# Patient Record
Sex: Female | Born: 1995 | Race: Black or African American | Hispanic: No | Marital: Single
Health system: Midwestern US, Community
[De-identification: ages and names within clinical notes are randomized; demographics above are authoritative.]

## PROBLEM LIST (undated history)

## (undated) ENCOUNTER — Inpatient Hospital Stay (HOSPITAL_COMMUNITY): Payer: Self-pay

## (undated) DIAGNOSIS — L508 Other urticaria: Secondary | ICD-10-CM

## (undated) DIAGNOSIS — D682 Hereditary deficiency of other clotting factors: Secondary | ICD-10-CM

## (undated) DIAGNOSIS — J45909 Unspecified asthma, uncomplicated: Secondary | ICD-10-CM

## (undated) DIAGNOSIS — D649 Anemia, unspecified: Secondary | ICD-10-CM

## (undated) HISTORY — PX: ABCESS DRAINAGE: SHX399

## (undated) HISTORY — PX: HERNIA REPAIR: SHX51

## (undated) HISTORY — PX: TONSILLECTOMY: SUR1361

## (undated) HISTORY — PX: TYMPANOSTOMY TUBE PLACEMENT: SHX32

## (undated) HISTORY — PX: TONSILLECTOMY AND ADENOIDECTOMY: SUR1326

## (undated) HISTORY — PX: TUMOR REMOVAL: SHX12

---
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
## 2015-05-23 ENCOUNTER — Inpatient Hospital Stay
Admit: 2015-05-23 | Discharge: 2015-05-23 | Disposition: A | Discharge status: Home / Self Care | Payer: Self-pay | Attending: Family Medicine

## 2015-05-23 DIAGNOSIS — R399 Unspecified symptoms and signs involving the genitourinary system: Secondary | ICD-10-CM

## 2015-05-23 LAB — POC URINE MACROSCOPIC
Bilirubin (POC): NEGATIVE
Glucose, urine (POC): NEGATIVE mg/dL
Ketones (POC): 15 mg/dL — AB
Nitrite (POC): NEGATIVE
Protein (POC): NEGATIVE mg/dL
Spec. gravity (POC): 1.03 (ref 1.003–1.030)
Urobilinogen (POC): 0.2 EU/dL (ref 0.2–1.0)
pH, urine  (POC): 6 (ref 5.0–8.0)

## 2015-05-23 LAB — HCG URINE, QL. - POC: Pregnancy test,urine (POC): NEGATIVE

## 2015-05-23 MED ORDER — FLUCONAZOLE 150 MG TAB
150 mg | ORAL_TABLET | ORAL | Status: AC
Start: 2015-05-23 — End: 2015-05-23

## 2015-05-23 MED ORDER — TRIMETHOPRIM-SULFAMETHOXAZOLE 160 MG-800 MG TAB
160-800 mg | ORAL_TABLET | Freq: Two times a day (BID) | ORAL | Status: AC
Start: 2015-05-23 — End: 2015-05-30

## 2015-05-23 NOTE — Other (Addendum)
Patient is a 19 y.o. female presenting with urinary tract infection. The history is provided by the patient.   Bladder Infection   This is a new problem. The current episode started 2 days ago. The problem occurs every urination. The problem has not changed since onset.The quality of the pain is described as burning. The pain is at a severity of 6/10. The pain is moderate. There has been no fever. She is sexually active. There is a history of pyelonephritis. Pertinent negatives include no chills, no sweats, no nausea, no vomiting, no discharge, no frequency, no hematuria, no hesitancy, no possible pregnancy, no urgency and no flank pain. Treatments tried: patient reports hx of same symptoms 1 month ago and treated for kidney infection with ciprofloxacin. Her past medical history does not include kidney stones. Past medical history comments: UTI.        Past Medical History   Diagnosis Date   ??? Factor VII deficiency (HCC)    ??? Anemia         History reviewed. No pertinent past surgical history.      History reviewed. No pertinent family history.     History     Social History   ??? Marital Status: SINGLE     Spouse Name: N/A   ??? Number of Children: N/A   ??? Years of Education: N/A     Occupational History   ??? Not on file.     Social History Main Topics   ??? Smoking status: Never Smoker    ??? Smokeless tobacco: Not on file   ??? Alcohol Use: Not on file   ??? Drug Use: Not on file   ??? Sexual Activity: Not on file     Other Topics Concern   ??? Not on file     Social History Narrative   ??? No narrative on file                ALLERGIES: Motrin    Review of Systems   Constitutional: Negative for fever, chills, diaphoresis and fatigue.   Gastrointestinal: Negative for nausea, vomiting, abdominal pain and diarrhea.   Genitourinary: Positive for dysuria and vaginal discharge (white discharge and vaginal itching.). Negative for hesitancy, urgency, frequency, hematuria, flank pain, decreased urine volume, vaginal bleeding, enuresis,  difficulty urinating, genital sores, vaginal pain, pelvic pain and dyspareunia.        Patient concerned she has an external vaginal cut and would like an external vaginal exam.    Musculoskeletal: Negative for myalgias.   Skin: Negative for color change, pallor and rash.       Filed Vitals:    05/23/15 1728   BP: 133/74   Pulse: 85   Temp: 98.1 ??F (36.7 ??C)   Resp: 18   Height:  (1.702 m)   Weight: 102.513 kg (226 lb)   SpO2: 96%       Physical Exam   Constitutional: She appears well-developed and well-nourished. She is active.  Non-toxic appearance. She does not appear ill. No distress.   Cardiovascular: Normal rate.    Pulmonary/Chest: Effort normal. No respiratory distress.   Abdominal: There is no CVA tenderness.   Genitourinary:       Pelvic exam was performed with patient supine. No labial fusion. There is no rash, tenderness, lesion or injury on the right labia. There is lesion (linear lesion (cut/tear) at fold of labia major and labia minor.) on the left labia. There is no rash, tenderness or injury on  the left labia. No erythema, tenderness or bleeding in the vagina. No foreign body around the vagina. No signs of injury around the vagina. Vaginal discharge (white (scant)) found.   Patient declines internal genital exam.    Neurological: She is alert. She is not disoriented. Gait normal.   Skin: Skin is warm and dry. She is not diaphoretic.   Psychiatric: She has a normal mood and affect. Her behavior is normal. Judgment and thought content normal.   Nursing note and vitals reviewed.      MDM     Differential Diagnosis; Clinical Impression; Plan:     CLINICAL IMPRESSION:  Symptoms of urinary tract infection  (primary encounter diagnosis)  Itching in the vaginal area (suspect yeast infection)    Patient declines internal vaginal exam. Discussed with patient STD and other vaginal tests cannot be obtained without internal pelvic/vaginal exam. Patient aware and continues to decline internal vaginal exam.    UPT negative  UA dip - see result   Urine culture     Plan:  Bactrim DS   Diflucan   Primary Care Provider for follow up as needed.   Emergency Department for worsening symptoms.   Plan discussed and patient is in agreement.         Amount and/or Complexity of Data Reviewed:   Clinical lab tests:  Ordered and reviewed   Review and summarize past medical records:  Yes   Discuss the patient with another provider:  Yes  Risk of Significant Complications, Morbidity, and/or Mortality:   Presenting problems:  Low  Diagnostic procedures:  Low  Management options:  Low  Progress:   Patient progress:  Stable      Procedures

## 2015-05-24 ENCOUNTER — Inpatient Hospital Stay: Admit: 2015-05-24 | Payer: Self-pay

## 2015-05-25 LAB — CULTURE, URINE
Colonies Counted: >100000
Colony Count: >100000

## 2015-05-26 NOTE — Other (Signed)
Left message with pt to check and see how she was doing.

## 2015-06-22 ENCOUNTER — Inpatient Hospital Stay
Admit: 2015-06-22 | Discharge: 2015-06-22 | Disposition: A | Discharge status: Home / Self Care | Payer: Self-pay | Attending: Family Medicine

## 2015-06-22 DIAGNOSIS — N390 Urinary tract infection, site not specified: Secondary | ICD-10-CM

## 2015-06-22 LAB — POC CHEM8
Anion gap (POC): 22 mmol/L — ABNORMAL HIGH (ref 5–15)
BUN (POC): 9 MG/DL (ref 9–20)
CO2 (POC): 22 MMOL/L (ref 21–32)
Calcium, ionized (POC): 1.25 MMOL/L (ref 1.12–1.32)
Chloride (POC): 102 MMOL/L (ref 98–107)
Creatinine (POC): 0.7 MG/DL (ref 0.6–1.3)
GFRAA, POC: >60 mL/min/{1.73_m2} (ref >60)
GFRNA, POC: >60 mL/min/{1.73_m2} (ref >60)
Glucose (POC): 119 MG/DL — ABNORMAL HIGH (ref 65–105)
Hematocrit (POC): 36 % (ref 35.0–47.0)
Hemoglobin (POC): 12.2 GM/DL (ref 11.5–16.0)
Potassium (POC): 3.9 MMOL/L (ref 3.5–5.1)
Sodium (POC): 141 MMOL/L (ref 136–145)

## 2015-06-22 LAB — POC URINE MACROSCOPIC
Bilirubin (POC): NEGATIVE
Glucose, urine (POC): NEGATIVE mg/dL
Ketones (POC): NEGATIVE mg/dL
Nitrite (POC): NEGATIVE
Protein (POC): NEGATIVE mg/dL
Spec. gravity (POC): 1.03 (ref 1.003–1.030)
Urobilinogen (POC): 0.2 EU/dL (ref 0.2–1.0)
pH, urine  (POC): 6 (ref 5.0–8.0)

## 2015-06-22 LAB — HCG URINE, QL. - POC: Pregnancy test,urine (POC): NEGATIVE

## 2015-06-22 MED ORDER — FLUCONAZOLE 150 MG TAB
150 mg | ORAL_TABLET | ORAL | Status: AC
Start: 2015-06-22 — End: 2015-06-22

## 2015-06-22 MED ORDER — CIPROFLOXACIN 500 MG TAB
500 mg | ORAL_TABLET | Freq: Two times a day (BID) | ORAL | Status: AC
Start: 2015-06-22 — End: 2015-06-25

## 2015-06-22 NOTE — Other (Addendum)
Patient is a 19 y.o. female presenting with vaginal discharge and chest pain. The history is provided by the patient.   Vaginal Discharge   This is a new problem. Episode onset: with itching. The problem occurs constantly. The problem has not changed since onset.The discharge occurs after urination. The discharge was white, cottage cheese like and thick. She is not pregnant. She has not missed her period. Associated symptoms include genital itching. Pertinent negatives include no anorexia, no diaphoresis, no fever, no abdominal swelling, no abdominal pain, no constipation, no diarrhea, no nausea, no vomiting, no dysuria, no frequency, no genital burning, no genital lesions, no perineal pain, no perineal odor and no painful intercourse. She has tried nothing for the symptoms.   Chest Pain (Angina)   This is a new problem. Episode onset: pain under left breast , does not have pain now. The problem has not changed since onset.Episode frequency: intermittent. The pain is associated with normal activity. The pain is present in the left side. The pain is at a severity of 6/10. The quality of the pain is described as sharp. The pain does not radiate. The symptoms are aggravated by movement. Pertinent negatives include no abdominal pain, no back pain, no claudication, no cough, no diaphoresis, no dizziness, no exertional chest pressure, no fever, no headaches, no hemoptysis, no irregular heartbeat, no leg pain, no lower extremity edema, no malaise/fatigue, no nausea, no near-syncope, no numbness, no orthopnea, no palpitations, no PND, no shortness of breath, no sputum production, no vomiting and no weakness. She has tried nothing for the symptoms.        Past Medical History   Diagnosis Date   ??? Factor VII deficiency (HCC)    ??? Anemia         History reviewed. No pertinent past surgical history.      History reviewed. No pertinent family history.     History     Social History   ??? Marital Status: SINGLE      Spouse Name: N/A   ??? Number of Children: N/A   ??? Years of Education: N/A     Occupational History   ??? Not on file.     Social History Main Topics   ??? Smoking status: Never Smoker    ??? Smokeless tobacco: Not on file   ??? Alcohol Use: Not on file   ??? Drug Use: Not on file   ??? Sexual Activity: Not on file     Other Topics Concern   ??? Not on file     Social History Narrative                ALLERGIES: Motrin    Review of Systems   Constitutional: Negative for fever, malaise/fatigue and diaphoresis.   Respiratory: Negative for cough, hemoptysis, sputum production and shortness of breath.    Cardiovascular: Positive for chest pain. Negative for palpitations, orthopnea, claudication, PND and near-syncope.   Gastrointestinal: Negative for nausea, vomiting, abdominal pain, diarrhea, constipation and anorexia.   Genitourinary: Positive for vaginal discharge. Negative for dysuria and frequency.   Musculoskeletal: Negative for back pain.   Neurological: Negative for dizziness, weakness, numbness and headaches.   All other systems reviewed and are negative.      Filed Vitals:    06/22/15 1835   BP: 119/81   Pulse: 98   Temp: 98.2 ??F (36.8 ??C)   Resp: 18   Height: 5\' 7"  (1.702 m)   Weight: 102.967 kg (227 lb)  SpO2: 98%       Physical Exam   Constitutional: She is oriented to person, place, and time. She appears well-developed and well-nourished.   HENT:   Head: Normocephalic.   Eyes: Conjunctivae are normal.   Neck: Normal range of motion. Neck supple.   Cardiovascular: Normal rate and regular rhythm.    No chest pain at this time  No shortness of breath   Pulmonary/Chest: Effort normal and breath sounds normal. No respiratory distress. She has no wheezes. She has no rales. She exhibits no tenderness.   Abdominal: Soft. Bowel sounds are normal. She exhibits no distension.   No suprapubic tenderness   Genitourinary: Vaginal discharge found.   White clumpy discharge noted on inspection  Denied vaginal exam    Musculoskeletal: Normal range of motion. She exhibits tenderness.   Tenderness to left side lateral breast,rib area pain is reproducible  Injury to ribs previous hx had xrays    No deformity   Neurological: She is alert and oriented to person, place, and time.   Skin: Skin is warm and dry.   Inner right thigh with non tender sebaceous cyst   Psychiatric: She has a normal mood and affect. Her behavior is normal.   Vitals reviewed.      MDM     Differential Diagnosis; Clinical Impression; Plan:     CLINICAL IMPRESSION:  Urinary tract infection with hematuria, site unspecified  (primary encounter diagnosis)    Plan:  1. No chest pain   2. Ed for worseningsymptoms  3. Chem 8-blood disorder recent heavy menstruation  Urine culture/hcg  cipro   Call for results of urine  2 days  GYN follow up  General surgeon for sebaceous cyst to inner right thigh, info gven  Amount and/or Complexity of Data Reviewed:   Clinical lab tests:  Ordered   Review and summarize past medical records:  Yes      Procedures

## 2015-06-23 ENCOUNTER — Inpatient Hospital Stay: Admit: 2015-06-23 | Payer: Self-pay

## 2015-06-23 LAB — CHLAMYDIA/GC PCR
Chlamydia amplified: NEGATIVE
N. gonorrhea, amplified: NEGATIVE

## 2015-06-24 LAB — CULTURE, URINE
Colonies Counted: >100000
Colony Count: >100000

## 2018-08-25 ENCOUNTER — Other Ambulatory Visit: Payer: Self-pay

## 2018-08-25 ENCOUNTER — Inpatient Hospital Stay (HOSPITAL_COMMUNITY): Payer: Medicaid Other

## 2018-08-25 ENCOUNTER — Encounter (HOSPITAL_COMMUNITY): Payer: Self-pay | Admitting: *Deleted

## 2018-08-25 ENCOUNTER — Inpatient Hospital Stay (HOSPITAL_COMMUNITY)
Admission: AD | Admit: 2018-08-25 | Discharge: 2018-08-25 | Disposition: A | Discharge status: Home / Self Care | Payer: Medicaid Other | Source: Ambulatory Visit | Attending: Obstetrics and Gynecology | Admitting: Obstetrics and Gynecology

## 2018-08-25 DIAGNOSIS — O208 Other hemorrhage in early pregnancy: Secondary | ICD-10-CM | POA: Insufficient documentation

## 2018-08-25 DIAGNOSIS — O26899 Other specified pregnancy related conditions, unspecified trimester: Secondary | ICD-10-CM

## 2018-08-25 DIAGNOSIS — J45909 Unspecified asthma, uncomplicated: Secondary | ICD-10-CM | POA: Insufficient documentation

## 2018-08-25 DIAGNOSIS — O26891 Other specified pregnancy related conditions, first trimester: Secondary | ICD-10-CM | POA: Diagnosis not present

## 2018-08-25 DIAGNOSIS — D682 Hereditary deficiency of other clotting factors: Secondary | ICD-10-CM | POA: Insufficient documentation

## 2018-08-25 DIAGNOSIS — Z886 Allergy status to analgesic agent status: Secondary | ICD-10-CM | POA: Insufficient documentation

## 2018-08-25 DIAGNOSIS — O99511 Diseases of the respiratory system complicating pregnancy, first trimester: Secondary | ICD-10-CM | POA: Insufficient documentation

## 2018-08-25 DIAGNOSIS — Z3A01 Less than 8 weeks gestation of pregnancy: Secondary | ICD-10-CM

## 2018-08-25 DIAGNOSIS — R109 Unspecified abdominal pain: Secondary | ICD-10-CM | POA: Diagnosis present

## 2018-08-25 DIAGNOSIS — O99111 Other diseases of the blood and blood-forming organs and certain disorders involving the immune mechanism complicating pregnancy, first trimester: Secondary | ICD-10-CM | POA: Diagnosis not present

## 2018-08-25 DIAGNOSIS — Z880 Allergy status to penicillin: Secondary | ICD-10-CM | POA: Diagnosis not present

## 2018-08-25 DIAGNOSIS — Z3491 Encounter for supervision of normal pregnancy, unspecified, first trimester: Secondary | ICD-10-CM

## 2018-08-25 DIAGNOSIS — O418X10 Other specified disorders of amniotic fluid and membranes, first trimester, not applicable or unspecified: Secondary | ICD-10-CM | POA: Diagnosis not present

## 2018-08-25 DIAGNOSIS — O468X1 Other antepartum hemorrhage, first trimester: Secondary | ICD-10-CM

## 2018-08-25 HISTORY — DX: Unspecified asthma, uncomplicated: J45.909

## 2018-08-25 HISTORY — DX: Anemia, unspecified: D64.9

## 2018-08-25 HISTORY — DX: Hereditary deficiency of other clotting factors: D68.2

## 2018-08-25 LAB — COMPREHENSIVE METABOLIC PANEL
ALT: 15 U/L (ref 0–44)
ANION GAP: 8 (ref 5–15)
AST: 20 U/L (ref 15–41)
Albumin: 3.8 g/dL (ref 3.5–5.0)
Alkaline Phosphatase: 85 U/L (ref 38–126)
BUN: 10 mg/dL (ref 6–20)
CO2: 23 mmol/L (ref 22–32)
Calcium: 9.5 mg/dL (ref 8.9–10.3)
Chloride: 104 mmol/L (ref 98–111)
Creatinine, Ser: 0.66 mg/dL (ref 0.44–1.00)
GFR calc Af Amer: >60 mL/min (ref >60)
Glucose, Bld: 90 mg/dL (ref 70–99)
POTASSIUM: 4 mmol/L (ref 3.5–5.1)
Sodium: 135 mmol/L (ref 135–145)
Total Bilirubin: 0.4 mg/dL (ref 0.3–1.2)
Total Protein: 7.3 g/dL (ref 6.5–8.1)

## 2018-08-25 LAB — CBC WITH DIFFERENTIAL/PLATELET
BASOS PCT: 0 %
Basophils Absolute: 0 10*3/uL (ref 0.0–0.1)
Eosinophils Absolute: 0.1 10*3/uL (ref 0.0–0.7)
Eosinophils Relative: 1 %
HCT: 32.7 % — ABNORMAL LOW (ref 36.0–46.0)
Hemoglobin: 9.9 g/dL — ABNORMAL LOW (ref 12.0–15.0)
Lymphocytes Relative: 41 %
Lymphs Abs: 3.7 10*3/uL (ref 0.7–4.0)
MCH: 22.9 pg — AB (ref 26.0–34.0)
MCHC: 30.3 g/dL (ref 30.0–36.0)
MCV: 75.7 fL — AB (ref 78.0–100.0)
MONO ABS: 0.5 10*3/uL (ref 0.1–1.0)
MONOS PCT: 5 %
NEUTROS PCT: 53 %
Neutro Abs: 4.7 10*3/uL (ref 1.7–7.7)
Platelets: 303 10*3/uL (ref 150–400)
RBC: 4.32 MIL/uL (ref 3.87–5.11)
RDW: 17.7 % — AB (ref 11.5–15.5)
WBC: 9 10*3/uL (ref 4.0–10.5)

## 2018-08-25 LAB — URINALYSIS, ROUTINE W REFLEX MICROSCOPIC
BILIRUBIN URINE: NEGATIVE
Glucose, UA: NEGATIVE mg/dL
Hgb urine dipstick: NEGATIVE
KETONES UR: NEGATIVE mg/dL
Leukocytes, UA: NEGATIVE
NITRITE: NEGATIVE
PH: 6 (ref 5.0–8.0)
PROTEIN: NEGATIVE mg/dL
Specific Gravity, Urine: 1.016 (ref 1.005–1.030)

## 2018-08-25 LAB — HCG, QUANTITATIVE, PREGNANCY: hCG, Beta Chain, Quant, S: 25205 m[IU]/mL — ABNORMAL HIGH (ref <5)

## 2018-08-25 LAB — ABO/RH: ABO/RH(D): B POS

## 2018-08-25 NOTE — MAU Note (Signed)
Having really bad pains, comes and goes.  Started on Sunday night.  Worse on left side. Hurting when she pees, that started today. No bleeding.  Heavy d/c.preg confirmed in office.

## 2018-08-25 NOTE — MAU Provider Note (Addendum)
History     CSN: [id number]  Arrival date and time: 08/25/18 1807   First Provider Initiated Contact with Patient 08/25/18 1927      Chief Complaint  Patient presents with  . Abdominal Pain  . Vaginal Discharge   HPI   Kathleen [name] is a 22 y.o. female G1P0 @ [redacted]w[redacted]d with a history of Factor VII deficiency here in MAU with abdominal pain. The pain started Sunday night. The pain went away enough to allow her to sleep, and then continued on Monday. The pain is worse on her left side than her right. She has not taken any medications for the pain. She rates her pain 4/10. When the pain comes it is an 8/10. No bleeding. Was seen at [facility] for confirmation only.   OB History    Gravida  1   Para      Term      Preterm      AB      Living        SAB      TAB      Ectopic      Multiple      Live Births              Past Medical History:  Diagnosis Date  . Anemia   . Asthma   . Factor VII deficiency [facility])     Past Surgical History:  Procedure Laterality Date  . ABCESS DRAINAGE    . HERNIA REPAIR    . TONSILLECTOMY    . TONSILLECTOMY AND ADENOIDECTOMY    . TUMOR REMOVAL     non cancerous  . TYMPANOSTOMY TUBE PLACEMENT      No family history on file.  Social History   Tobacco Use  . Smoking status: Never Smoker  . Smokeless tobacco: Never Used  Substance Use Topics  . Alcohol use: Not Currently  . Drug use: Never    Allergies:  Allergies  Allergen Reactions  . Citrus Hives and Itching  . Nsaids Other (See Comments)    Cannot take due to blood disorder  . Penicillins Hives and Swelling    No medications prior to admission.   Results for orders placed or performed during the hospital encounter of 08/25/18 (from the past 48 hour(s))  Urinalysis, Routine w reflex microscopic     Status: None   Collection Time: 08/25/18  6:46 PM  Result Value Ref Range   Color, Urine YELLOW YELLOW   APPearance CLEAR CLEAR   Specific Gravity,  Urine 1.016 1.005 - 1.030   pH 6.0 5.0 - 8.0   Glucose, UA NEGATIVE NEGATIVE mg/dL   Hgb urine dipstick NEGATIVE NEGATIVE   Bilirubin Urine NEGATIVE NEGATIVE   Ketones, ur NEGATIVE NEGATIVE mg/dL   Protein, ur NEGATIVE NEGATIVE mg/dL   Nitrite NEGATIVE NEGATIVE   Leukocytes, UA NEGATIVE NEGATIVE    Comment: Performed at [facility], [address] [place], [place] [address]   Review of Systems  Constitutional: Negative for fever.  Gastrointestinal: Positive for abdominal pain.  Genitourinary: Negative for dysuria and vaginal bleeding.  Musculoskeletal: Negative for back pain.   Physical Exam   Blood pressure 113/70, pulse 86, temperature 99 F (37.2 C), temperature source Oral, resp. rate 18, height 5\' 6"  (1.676 m), weight 107.3 kg, last menstrual period 07/14/2018, SpO2 100 %.  Physical Exam  Constitutional: She is oriented to person, place, and time. She appears well-developed and well-nourished. No distress.  HENT:  Head: Normocephalic.  Eyes: Pupils are equal, round, and reactive to light.  GI: There is tenderness in the periumbilical area, suprapubic area and left lower quadrant. There is no rigidity, no rebound and no guarding.  Musculoskeletal: Normal range of motion.  Neurological: She is alert and oriented to person, place, and time.  Skin: Skin is warm. She is not diaphoretic.  Psychiatric: Her behavior is normal.   MAU Course  Procedures  US Ob Less Than 14 Weeks With Ob Transvaginal  Result Date: 08/25/2018 CLINICAL DATA:  Pregnant patient with abdominal pain. EXAM: OBSTETRIC <14 WK [place] AND TRANSVAGINAL OB US TECHNIQUE: Both transabdominal and transvaginal ultrasound examinations were performed for complete evaluation of the gestation as well as the maternal uterus, adnexal regions, and pelvic cul-de-sac. Transvaginal technique was performed to assess early pregnancy. COMPARISON:  None. FINDINGS: Intrauterine gestational sac: Single Yolk sac:  Visualized. Embryo:   Visualized. Cardiac Activity: Visualized. Heart Rate: 85 bpm MSD:   mm    w     d CRL:  2.3 mm   5 w   5 d                  [place] EDC: April 22, 2019 Subchorionic hemorrhage: A small subchorionic hemorrhage is identified. Maternal uterus/adnexae: Corpus luteum cyst is seen in the right ovary. The left ovary is normal. A small amount of physiologic fluid is seen in the pelvis. IMPRESSION: 1. Single live IUP. 2. Small subchorionic hemorrhage. Electronically Signed   By: Gerome Sam III M.D   On: 08/25/2018 20:55   Results for orders placed or performed during the hospital encounter of 08/25/18 (from the past 24 hour(s))  Urinalysis, Routine w reflex microscopic     Status: None   Collection Time: 08/25/18  6:46 PM  Result Value Ref Range   Color, Urine YELLOW YELLOW   APPearance CLEAR CLEAR   Specific Gravity, Urine 1.016 1.005 - 1.030   pH 6.0 5.0 - 8.0   Glucose, UA NEGATIVE NEGATIVE mg/dL   Hgb urine dipstick NEGATIVE NEGATIVE   Bilirubin Urine NEGATIVE NEGATIVE   Ketones, ur NEGATIVE NEGATIVE mg/dL   Protein, ur NEGATIVE NEGATIVE mg/dL   Nitrite NEGATIVE NEGATIVE   Leukocytes, UA NEGATIVE NEGATIVE  CBC with Differential/Platelet     Status: Abnormal   Collection Time: 08/25/18  7:53 PM  Result Value Ref Range   WBC 9.0 4.0 - 10.5 K/uL   RBC 4.32 3.87 - 5.11 MIL/uL   Hemoglobin 9.9 (L) 12.0 - 15.0 g/dL   HCT 40.9 (L) 81.1 - 91.4 %   MCV 75.7 (L) 78.0 - 100.0 fL   MCH 22.9 (L) 26.0 - 34.0 pg   MCHC 30.3 30.0 - 36.0 g/dL   RDW 78.2 (H) 95.6 - 21.3 %   Platelets 303 150 - 400 K/uL   Neutrophils Relative % 53 %   Neutro Abs 4.7 1.7 - 7.7 K/uL   Lymphocytes Relative 41 %   Lymphs Abs 3.7 0.7 - 4.0 K/uL   Monocytes Relative 5 %   Monocytes Absolute 0.5 0.1 - 1.0 K/uL   Eosinophils Relative 1 %   Eosinophils Absolute 0.1 0.0 - 0.7 K/uL   Basophils Relative 0 %   Basophils Absolute 0.0 0.0 - 0.1 K/uL  Comprehensive metabolic panel     Status: None   Collection Time: 08/25/18  7:53  PM  Result Value Ref Range   Sodium 135 135 - 145 mmol/L   Potassium 4.0 3.5 - 5.1 mmol/L  Chloride 104 98 - 111 mmol/L   CO2 23 22 - 32 mmol/L   Glucose, Bld 90 70 - 99 mg/dL   BUN 10 6 - 20 mg/dL   Creatinine, Ser 8.29 0.44 - 1.00 mg/dL   Calcium 9.5 8.9 - 56.2 mg/dL   Total Protein 7.3 6.5 - 8.1 g/dL   Albumin 3.8 3.5 - 5.0 g/dL   AST 20 15 - 41 U/L   ALT 15 0 - 44 U/L   Alkaline Phosphatase 85 38 - 126 U/L   Total Bilirubin 0.4 0.3 - 1.2 mg/dL   GFR calc non Af Amer >60 >60 mL/min   GFR calc Af Amer >60 >60 mL/min   Anion gap 8 5 - 15  ABO/Rh     Status: None (Preliminary result)   Collection Time: 08/25/18  7:53 PM  Result Value Ref Range   ABO/RH(D)      B POS Performed at [facility], [address], [place], [place] [address]   hCG, quantitative, pregnancy     Status: Abnormal   Collection Time: 08/25/18  7:53 PM  Result Value Ref Range   hCG, Beta Chain, Quant, S 25,205 (H) <5 mIU/mL      MDM  Blood type: HIV, CBC, Hcg, ABO US OB transvaginal  Report given to Judeth Horn NP who resumes care of the patient.   Rasch, Harolyn Rutherford, NP  B positive Ultrasound shows IUP with cardiac activity & small [facility] Discussed results with patient, including low FHR which could be normal this early in the pregnancy. Pt has hemonc appt next week & states her doctor plans on referring her to [facility]. Also recently approved for medicaid so d/t those 2 reasons will not be returning to [facility].   Assessment and Plan  A: 1. Normal IUP (intrauterine pregnancy) on prenatal ultrasound, first trimester   2. Abdominal pain in pregnancy   3. [redacted] weeks gestation of pregnancy   4. Subchorionic hematoma in first trimester, single or unspecified fetus    P: Discharge home Bleeding/SAB precautions Start prenatal care  Judeth Horn, NP

## 2018-08-25 NOTE — Discharge Instructions (Signed)
Subchorionic Hematoma °A subchorionic hematoma is a gathering of blood between the outer wall of the placenta and the inner wall of the womb (uterus). The placenta is the organ that connects the fetus to the wall of the uterus. The placenta performs the feeding, breathing (oxygen to the fetus), and waste removal (excretory work) of the fetus. °Subchorionic hematoma is the most common abnormality found on a result from ultrasonography done during the first trimester or early second trimester of pregnancy. If there has been little or no vaginal bleeding, early small hematomas usually shrink on their own and do not affect your baby or pregnancy. The blood is gradually absorbed over 1-2 weeks. When bleeding starts later in pregnancy or the hematoma is larger or occurs in an older pregnant woman, the outcome may not be as good. Larger hematomas may get bigger, which increases the chances for miscarriage. Subchorionic hematoma also increases the risk of premature detachment of the placenta from the uterus, preterm (premature) labor, and stillbirth. °Follow these instructions at home: °· Stay on bed rest if your health care provider recommends this. Although bed rest will not prevent more bleeding or prevent a miscarriage, your health care provider may recommend bed rest until you are advised otherwise. °· Avoid heavy lifting (more than 10 lb [4.5 kg]), exercise, sexual intercourse, or douching as directed by your health care provider. °· Keep track of the number of pads you use each day and how soaked (saturated) they are. Write down this information. °· Do not use tampons. °· Keep all follow-up appointments as directed by your health care provider. Your health care provider may ask you to have follow-up blood tests or ultrasound tests or both. °Get help right away if: °· You have severe cramps in your stomach, back, abdomen, or pelvis. °· You have a fever. °· You pass large clots or tissue. Save any tissue for your  health care provider to look at. °· Your bleeding increases or you become lightheaded, feel weak, or have fainting episodes. °This information is not intended to replace advice given to you by your health care provider. Make sure you discuss any questions you have with your health care provider. °Document Released: 03/26/2007 Document Revised: 05/16/2016 Document Reviewed: 07/08/2013 °[employer]vier Interactive Patient Education © 2017 [employer] ° °

## 2019-01-22 ENCOUNTER — Encounter (HOSPITAL_COMMUNITY): Payer: Self-pay | Admitting: *Deleted

## 2019-01-22 ENCOUNTER — Inpatient Hospital Stay (HOSPITAL_COMMUNITY)
Admission: AD | Admit: 2019-01-22 | Discharge: 2019-01-22 | Disposition: A | Discharge status: Home / Self Care | Payer: Medicaid Other | Attending: Obstetrics and Gynecology | Admitting: Obstetrics and Gynecology

## 2019-01-22 DIAGNOSIS — R109 Unspecified abdominal pain: Secondary | ICD-10-CM | POA: Diagnosis not present

## 2019-01-22 DIAGNOSIS — O368120 Decreased fetal movements, second trimester, not applicable or unspecified: Secondary | ICD-10-CM

## 2019-01-22 DIAGNOSIS — O99112 Other diseases of the blood and blood-forming organs and certain disorders involving the immune mechanism complicating pregnancy, second trimester: Secondary | ICD-10-CM | POA: Diagnosis not present

## 2019-01-22 DIAGNOSIS — Z3A27 27 weeks gestation of pregnancy: Secondary | ICD-10-CM | POA: Diagnosis not present

## 2019-01-22 DIAGNOSIS — O26891 Other specified pregnancy related conditions, first trimester: Secondary | ICD-10-CM

## 2019-01-22 DIAGNOSIS — D66 Hereditary factor VIII deficiency: Secondary | ICD-10-CM | POA: Insufficient documentation

## 2019-01-22 DIAGNOSIS — O26899 Other specified pregnancy related conditions, unspecified trimester: Secondary | ICD-10-CM

## 2019-01-22 LAB — URINALYSIS, ROUTINE W REFLEX MICROSCOPIC
BILIRUBIN URINE: NEGATIVE
GLUCOSE, UA: NEGATIVE mg/dL
Hgb urine dipstick: NEGATIVE
KETONES UR: NEGATIVE mg/dL
Leukocytes, UA: NEGATIVE
NITRITE: NEGATIVE
PH: 7.5 (ref 5.0–8.0)
Protein, ur: NEGATIVE mg/dL
Specific Gravity, Urine: 1.015 (ref 1.005–1.030)

## 2019-01-22 LAB — WET PREP, GENITAL
CLUE CELLS WET PREP: NONE SEEN
SPERM: NONE SEEN
Trich, Wet Prep: NONE SEEN
YEAST WET PREP: NONE SEEN

## 2019-01-22 NOTE — MAU Provider Note (Signed)
History     CSN: [id number]  Arrival date and time: 01/22/19 0071   First Provider Initiated Contact with Patient 01/22/19 1007      Chief Complaint  Patient presents with  . Decreased Fetal Movement  . Abdominal Pain   Kathleen [name] is a 23 y.o. G1P0 at 108w3d who presents for Decreased Fetal Movement and Abdominal Pain.  Patient states she hasn't felt movement in 2 days with the onset of cramping.  She states the cramping started Tuesday night and has been occurring in "intervals."  She endorses vaginal discharge that is a "cloudy slimy" consistency with a whitish color.  Patient denies sexual activity in the past 72 hours or issues with constipation, diarrhea, or urination.  Patient states she has not taken anything for the pain due to her hemophilia diagnosis.  She states she took a warm shower last night that provided some relief.     OB History    Gravida  1   Para      Term      Preterm      AB      Living        SAB      TAB      Ectopic      Multiple      Live Births              Past Medical History:  Diagnosis Date  . Anemia   . Asthma   . 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 ABCESS DRAINAGE    . HERNIA REPAIR    . TONSILLECTOMY    . TONSILLECTOMY AND ADENOIDECTOMY    . TUMOR REMOVAL     non cancerous  . TYMPANOSTOMY TUBE PLACEMENT      History reviewed. No pertinent family history.  Social History   Tobacco Use  . Smoking status: Never Smoker  . Smokeless tobacco: Never Used  Substance Use Topics  . Alcohol use: Not Currently  . Drug use: Never    Allergies:  Allergies  Allergen Reactions  . Citrus Hives and Itching  . Nsaids Other (See Comments)    Cannot take due to blood disorder  . Penicillins Hives and Swelling    No medications prior to admission.    Review of Systems  Constitutional: Negative for chills and fever.  Gastrointestinal: Positive for abdominal  pain, nausea and vomiting. Negative for constipation and diarrhea.  Genitourinary: Positive for vaginal discharge. Negative for dysuria and vaginal bleeding.  Musculoskeletal: Positive for back pain.   Physical Exam   Temperature 98.4 F (36.9 C), temperature source Oral, resp. rate 20, height 5\' 6"  (1.676 m), weight 107.4 kg, last menstrual period 07/14/2018.  Physical Exam  Constitutional: She is oriented to person, place, and time. She appears well-developed and well-nourished.  HENT:  Head: Normocephalic and atraumatic.  Eyes: Conjunctivae are normal.  Neck: Normal range of motion.  Cardiovascular: Normal rate, regular rhythm and normal heart sounds.  Respiratory: Effort normal and breath sounds normal.  GI: Soft. Bowel sounds are normal.  Genitourinary: Cervix exhibits no motion tenderness and no discharge.    Vaginal discharge present.     No vaginal bleeding.  No bleeding in the vagina.    Genitourinary Comments: Speculum Exam: -Vaginal Vault: Pink mucosa.  Moderate amt thick white discharge -wet prep collected -Cervix:Pink, no lesions, cysts, or polyps.  Appears closed. No active bleeding or discharge from  os-GC/CT collected -Bimanual Exam: Closed/Long/Thick No tenderness in cul de sac   Musculoskeletal: Normal range of motion.  Neurological: She is alert and oriented to person, place, and time.  Skin: Skin is warm and dry.  Psychiatric: She has a normal mood and affect. Her behavior is normal.   135 bpm, Mod Var, -Decels, +Accels Toco: None graphed or palpated  MAU Course  Procedures Results for orders placed or performed during the hospital encounter of 01/22/19 (from the past 24 hour(s))  Wet prep, genital     Status: Abnormal   Collection Time: 01/22/19 10:18 AM  Result Value Ref Range   Yeast Wet Prep HPF POC NONE SEEN NONE SEEN   Trich, Wet Prep NONE SEEN NONE SEEN   Clue Cells Wet Prep HPF POC NONE SEEN NONE SEEN   WBC, Wet Prep HPF POC FEW (A) NONE SEEN    Sperm NONE SEEN   Urinalysis, Routine w reflex microscopic     Status: None   Collection Time: 01/22/19 10:18 AM  Result Value Ref Range   Color, Urine YELLOW YELLOW   APPearance CLEAR CLEAR   Specific Gravity, Urine 1.015 1.005 - 1.030   pH 7.5 5.0 - 8.0   Glucose, UA NEGATIVE NEGATIVE mg/dL   Hgb urine dipstick NEGATIVE NEGATIVE   Bilirubin Urine NEGATIVE NEGATIVE   Ketones, ur NEGATIVE NEGATIVE mg/dL   Protein, ur NEGATIVE NEGATIVE mg/dL   Nitrite NEGATIVE NEGATIVE   Leukocytes, UA NEGATIVE NEGATIVE    MDM Pelvic Exam with cultures Labs: UA, Wet prep, and GC/CT EFM  Assessment and Plan  DFM Abdominal Pain Hemophilia  Cat I FT  -Exam findings discussed -Wet prep Pending -Reactive NST -Offered and declines Tylenol dosing for pain citing hemophilia. Educated that tylenol is not a contraindication to hemophilia. -Will await results  Follow Up (11:00 AM)  -Results return back negative -Patient informed of negative results -No questions or concerns -Encouraged to keep appt as scheduled with primary OB provider -Encouraged to call or return to MAU if symptoms worsen or with the onset of new symptoms. -Discharged to home in stable condition  Cherre Robins MSN, CNM 01/22/2019, 10:07 AM

## 2019-01-22 NOTE — MAU Note (Signed)
C/O intermittent lower abdominal pain and constant back pain for two days.  Also reports not feeling the baby move for past two days.  Denies vaginal bleeding or LOF, but has cloudy discharge. Also c/o a "bump" that appeared 2 days ago under right breast that is sore to the touch.  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

## 2019-01-22 NOTE — Discharge Instructions (Signed)
Abdominal Pain During Pregnancy ° °Abdominal pain is common during pregnancy, and has many possible causes. Some causes are more serious than others, and sometimes the cause is not known. Abdominal pain can be a sign that labor is starting. It can also be caused by normal growth and stretching of muscles and ligaments during pregnancy. Always tell your health care provider if you have any abdominal pain. °Follow these instructions at home: °· Do not have sex or put anything in your vagina until your pain goes away completely. °· Get plenty of rest until your pain improves. °· Drink enough fluid to keep your urine pale yellow. °· Take over-the-counter and prescription medicines only as told by your health care provider. °· Keep all follow-up visits as told by your health care provider. This is important. °Contact a health care provider if: °· Your pain continues or gets worse after resting. °· You have lower abdominal pain that: °? Comes and goes at regular intervals. °? Spreads to your back. °? Is similar to menstrual cramps. °· You have pain or burning when you urinate. °Get help right away if: °· You have a fever or chills. °· You have vaginal bleeding. °· You are leaking fluid from your vagina. °· You are passing tissue from your vagina. °· You have vomiting or diarrhea that lasts for more than 24 hours. °· Your baby is moving less than usual. °· You feel very weak or faint. °· You have shortness of breath. °· You develop severe pain in your upper abdomen. °Summary °· Abdominal pain is common during pregnancy, and has many possible causes. °· If you experience abdominal pain during pregnancy, tell your health care provider right away. °· Follow your health care provider's home care instructions and keep all follow-up visits as directed. °This information is not intended to replace advice given to you by your health care provider. Make sure you discuss any questions you have with your health care  provider. °Document Released: 12/09/2005 Document Revised: 03/13/2017 Document Reviewed: 03/13/2017 °Elsevier Interactive Patient Education © 2019 [employer] ° °

## 2019-01-25 LAB — GC/CHLAMYDIA PROBE AMP (~~LOC~~) NOT AT ARMC
Chlamydia: NEGATIVE
Neisseria Gonorrhea: NEGATIVE

## 2019-07-05 ENCOUNTER — Encounter: Payer: Self-pay | Admitting: Allergy

## 2019-07-05 ENCOUNTER — Other Ambulatory Visit: Payer: Self-pay

## 2019-07-05 ENCOUNTER — Ambulatory Visit (INDEPENDENT_AMBULATORY_CARE_PROVIDER_SITE_OTHER): Payer: Medicaid Other | Admitting: Allergy

## 2019-07-05 VITALS — BP 110/78 | HR 84 | Temp 98.1°F | Resp 16 | Ht 66.0 in | Wt 231.4 lb

## 2019-07-05 DIAGNOSIS — Z880 Allergy status to penicillin: Secondary | ICD-10-CM | POA: Insufficient documentation

## 2019-07-05 DIAGNOSIS — Z91038 Other insect allergy status: Secondary | ICD-10-CM | POA: Insufficient documentation

## 2019-07-05 DIAGNOSIS — T781XXA Other adverse food reactions, not elsewhere classified, initial encounter: Secondary | ICD-10-CM | POA: Insufficient documentation

## 2019-07-05 DIAGNOSIS — L508 Other urticaria: Secondary | ICD-10-CM | POA: Diagnosis not present

## 2019-07-05 DIAGNOSIS — Z91030 Bee allergy status: Secondary | ICD-10-CM

## 2019-07-05 DIAGNOSIS — T781XXD Other adverse food reactions, not elsewhere classified, subsequent encounter: Secondary | ICD-10-CM | POA: Diagnosis not present

## 2019-07-05 DIAGNOSIS — T7819XA Other adverse food reactions, not elsewhere classified, initial encounter: Secondary | ICD-10-CM | POA: Insufficient documentation

## 2019-07-05 DIAGNOSIS — J3089 Other allergic rhinitis: Secondary | ICD-10-CM | POA: Insufficient documentation

## 2019-07-05 DIAGNOSIS — J45990 Exercise induced bronchospasm: Secondary | ICD-10-CM | POA: Insufficient documentation

## 2019-07-05 HISTORY — DX: Other urticaria: L50.8

## 2019-07-05 MED ORDER — CETIRIZINE HCL 10 MG PO TABS: 10.0000 mg | ORAL_TABLET | Freq: Every day | ORAL | 0 refills | Status: DC

## 2019-07-05 NOTE — Assessment & Plan Note (Addendum)
Rhinitis symptoms during the spring for the past 20 years.  Patient had allergy skin testing over 10 years ago and showed multiple positives per patient report.   Today skin testing was negative to environmental allergies.  Monitor symptoms.

## 2019-07-05 NOTE — Assessment & Plan Note (Signed)
Facial swelling after sting in the past. No previous work up.  Get bloodwork.  Avoid insect/bug stings.  For mild symptoms you can take over the counter antihistamines such as Benadryl and monitor symptoms closely. If symptoms worsen or if you have severe symptoms including breathing issues, throat closure, significant swelling, whole body hives, severe diarrhea and vomiting, lightheadedness then seek immediate medical care.

## 2019-07-05 NOTE — Assessment & Plan Note (Signed)
Currently avoiding pineapples, oranges, mango, peaches, plums and shellfish.  Fish caused hives and throat tightness in the past.  Fresh foods cause coughing, sneezing and hives.  Today skin testing was negative to food panel.  Continue to avoid pineapple, oranges, mango, peaches, plums, shellfish.  For mild symptoms you can take over the counter antihistamines such as Benadryl and monitor symptoms closely. If symptoms worsen or if you have severe symptoms including breathing issues, throat closure, significant swelling, whole body hives, severe diarrhea and vomiting, lightheadedness then seek immediate medical care. Food allergen skin testing has excellent negative predictive value however there is still a 5% chance that the allergy exists. Therefore, we will investigate further with serum specific IgE levels and, if negative then schedule for open graded oral food challenge.

## 2019-07-05 NOTE — Assessment & Plan Note (Signed)
Breaking out in pruritic hives for the past 3 months.  No triggers noted.  June and May 2020 bloodwork - CBC diff, CMP, ESR unremarkable. Crp elevated.  Good benefit with taking Zyrtec 10 mg daily.  Today's skin testing was negative to food panel and environmental allergies.  Get bloodwork as below to rule out any other etiologies. . Based on clinical history, she likely has chronic idiopathic urticaria. Discussed with patient, that urticaria is usually caused by release of histamine by cutaneous mast cells but sometimes it is non-histamine mediated. Explained that urticaria is not always associated with allergies, and may be related to other infectious or autoimmune causes. In most cases, the exact etiology for urticaria can not be established and it is considered idiopathic. Marland Kitchen Meanwhile start the following medications: zyrtec 28m daily. . Avoid the following potential triggers: alcohol, tight clothing, NSAIDs.

## 2019-07-05 NOTE — Progress Notes (Addendum)
New Patient Note  RE: Kathleen [name] MRN: [id number] DOB: 26-Nov-1996 Date of Office Visit: 07/05/2019  Referring provider: Joya [name], * Primary care provider: Joya Gaskins, FNP  Chief Complaint: Establish Care (hives only legs, back and arms)  History of Present Illness: I had the pleasure of seeing Kathleen [name] for initial evaluation at the [facility] on 07/05/2019. She is a 23 y.o. female, who is referred here by Kathleen Gaskins, FNP for the evaluation of urticaria.   Rash started about 3 months ago which is about 4 weeks after she had her daughter. .Mainly occurs on her arms, legs and back. Describes them as pruritic, hives, erythematous at times. Individual rashes lasts about a few minutes. No ecchymosis upon resolution. Associated symptoms include: denies. Suspected triggers are unknown. Denies any fevers, chills, changes in medications, foods, personal care products or recent infections. She has tried the following therapies: zyrtec 40m daily with good benefit. Systemic steroids no. Currently on zyrtec 138mdaily.  Previous work up includes: June and May 2020 bloodwork - CBC diff, CMP, ESR unremarkable.  Crp elevated. Previous history of rash/hives: denies. Patient is up to date with the following cancer screening tests: pap smears.  Assessment and Plan: [name] a [age].o. female with: Chronic urticaria Breaking out in pruritic hives for the past 3 months.  No triggers noted.  June and May 2020 bloodwork - CBC diff, CMP, ESR unremarkable. Crp elevated.  Good benefit with taking Zyrtec 10 mg daily.  Today's skin testing was negative to food panel and environmental allergies.  Get bloodwork as below to rule out any other etiologies. . Based on clinical history, she likely has chronic idiopathic urticaria. Discussed with patient, that urticaria is usually caused by release of histamine by cutaneous mast cells but sometimes it is  non-histamine mediated. Explained that urticaria is not always associated with allergies, and may be related to other infectious or autoimmune causes. In most cases, the exact etiology for urticaria can not be established and it is considered idiopathic. . Marland Kitcheneanwhile start the following medications: zyrtec 1060maily. . Avoid the following potential triggers: alcohol, tight clothing, NSAIDs.   Other allergic rhinitis Rhinitis symptoms during the spring for the past 20 years.  Patient had allergy skin testing over 10 years ago and showed multiple positives per patient report.   Today skin testing was negative to environmental allergies.  Monitor symptoms.   Adverse food reaction Currently avoiding pineapples, oranges, mango, peaches, plums and shellfish.  Fish caused hives and throat tightness in the past.  Fresh foods cause coughing, sneezing and hives.  Today skin testing was negative to food panel.  Continue to avoid pineapple, oranges, mango, peaches, plums, shellfish.  For mild symptoms you can take over the counter antihistamines such as Benadryl and monitor symptoms closely. If symptoms worsen or if you have severe symptoms including breathing issues, throat closure, significant swelling, whole body hives, severe diarrhea and vomiting, lightheadedness then seek immediate medical care. Food allergen skin testing has excellent negative predictive value however there is still a 5% chance that the allergy exists. Therefore, we will investigate further with serum specific IgE levels and, if negative then schedule for open graded oral food challenge.  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  Currently taking albuterol as needed less than once a week with good benefit.  Monitor symptoms.  May use albuterol rescue inhaler 2 puffs or nebulizer every 4 to 6  hours as needed for shortness of breath, chest tightness, coughing, and wheezing. May use albuterol  rescue inhaler 2 puffs 5 to 15 minutes prior to strenuous physical activities. Monitor frequency of use.   Get spirometry at next visit.  Penicillin allergy History of breaking out in hives and swelling after penicillin ingestion.  Continue avoidance and consider penicillin skin testing in future.  Hymenoptera allergy Facial swelling after sting in the past. No previous work up.  Get bloodwork.  Avoid insect/bug stings.  For mild symptoms you can take over the counter antihistamines such as Benadryl and monitor symptoms closely. If symptoms worsen or if you have severe symptoms including breathing issues, throat closure, significant swelling, whole body hives, severe diarrhea and vomiting, lightheadedness then seek immediate medical care.  Return in about 2 months (around 09/05/2019).  Meds ordered this encounter  Medications  . cetirizine (ZYRTEC) 10 MG tablet    Sig: Take 1 tablet (10 mg total) by mouth daily.    Dispense:  90 tablet    Refill:  0    Lab Orders     Allergens w/Total IgE Area 2     Tryptase     Thyroid Cascade Profile     ANA w/Reflex     Alpha-Gal Panel     C3 and C4     Allergen Peach f95     Allergen, Pineapple, f210     Orange IgE     Allergen, Mango, f91     F255-IgE Plum     Allergen Profile, Shellfish     Allergen Hymenoptera Panel  Other allergy screening: Asthma: yes  She reports symptoms of shortness of breath for 20+ years. Current medications include albuterol prn which help. She reports not using aerochamber with asthma inhalers. She tried the following inhalers: Flovent. Main asthma triggers are exertion. In the last month, frequency of asthma symptoms: <1x/week. Frequency of nocturnal symptoms: 0x/month. Frequency of SABA use: 1x/week. Interference with physical activity: sometimes.   Rhino conjunctivitis: yes  She reports symptoms of PND. Symptoms have been going on for 20 years. The symptoms are present during the spring. Anosmia: no.  Headache: no. She has used Claritin with fair improvement in symptoms. Previous work up includes: 10 years had skin testing which showed multiple positives per patient report.  Food allergy: yes  Currently avoiding pineapple, oranges, mango, peaches, plums, shellfish,  Shellfish causes hives and throat tightness. Fruits cause coughing, sneezing, hives.   Dietary History: patient has been eating other foods including milk, eggs, peanut, seafood - tuna, wheat, meats, limited fruits and vegetables. Not eating tree nuts, sesame, soy products.  Medication allergy: yes  Penicillin - hives, swelling Hymenoptera allergy: yes Facial swelling after got stung once.  Eczema:no History of recurrent infections suggestive of immunodeficency: no  Diagnostics: Skin Testing: Environmental allergy panel and select foods. Negative test to: both panels.  Results discussed with patient/family. Airborne Adult Perc - 07/05/19 1012    Time Antigen Placed  1012    Allergen Manufacturer  Lavella Hammock    Location  Back    Number of Test  59    Panel 1  Select    1. Control-Buffer 50% Glycerol  Negative    2. Control-Histamine 1 mg/ml  2+    3. Albumin saline  Negative    4. [place]  Negative    5. [place]  Negative    6. Johnson  Negative    7. Jennings Blue  Negative    8. Victory Dakin  Fescue  Negative    9. Perennial Rye  Negative    10. Sweet Vernal  Negative    11. Timothy  Negative    12. Cocklebur  Negative    13. Burweed Marshelder  Negative    14. Ragweed, short  Negative    15. Ragweed, Giant  Negative    16. Plantain,  English  Negative    17. Lamb's Quarters  Negative    18. Sheep Sorrell  Negative    19. Rough Pigweed  Negative    20. Marsh Elder, Rough  Negative    21. Mugwort, Common  Negative    22. Ash mix  Negative    23. Birch mix  Negative    24. Beech American  Negative    25. Box, Elder  Negative    26. Cedar, red  Negative    27. Cottonwood, [place]  Negative    28. Elm mix  Negative     29. Hickory mix  Negative    30. Maple mix  Negative    31. Oak, [place] mix  Negative    32. Pecan Pollen  Negative    33. Pine mix  Negative    34. Sycamore Eastern  Negative    35. Willards, Black Pollen  Negative    36. Alternaria alternata  Negative    37. Cladosporium Herbarum  Negative    38. Aspergillus mix  Negative    39. Penicillium mix  Negative    40. Bipolaris sorokiniana (Helminthosporium)  Negative    41. Drechslera spicifera (Curvularia)  Negative    42. Mucor plumbeus  Negative    43. Fusarium moniliforme  Negative    44. Aureobasidium pullulans (pullulara)  Negative    45. Rhizopus oryzae  Negative    46. Botrytis cinera  Negative    47. Epicoccum nigrum  Negative    48. Phoma betae  Negative    49. Candida Albicans  Negative    50. Trichophyton mentagrophytes  Negative    51. Mite, D Farinae  5,000 AU/ml  Negative    52. Mite, D Pteronyssinus  5,000 AU/ml  Negative    53. Cat Hair 10,000 BAU/ml  Negative    54.  Dog Epithelia  Negative    55. Mixed Feathers  Negative    56. Horse Epithelia  Negative    57. Cockroach, German  Negative    58. Mouse  Negative    59. Tobacco Leaf  Negative    Comments  Done by Courtney/CMA     Food Adult Perc - 07/05/19 1000    Time Antigen Placed  1013    Allergen Manufacturer  Lavella Hammock    Location  Back    Number of allergen test  72    1. Peanut  Negative    2. Soybean  Negative    3. Wheat  Negative    4. Sesame  Negative    5. Milk, cow  Negative    6. Egg White, Chicken  Negative    7. Casein  Negative    8. Shellfish Mix  Negative    9. Fish Mix  Negative    10. Cashew  Negative    11. Pecan Food  Negative    12. [place]  Negative    13. Almond  Negative    14. Hazelnut  Negative    15. [place] nut  Negative    16. Coconut  Negative    17. Pistachio  Negative    18. Catfish  Negative    19. Bass  Negative    20. Trout  Negative    21. Tuna  Negative    22. Salmon  Negative    23. Flounder  Negative     24. Codfish  Negative    25. Shrimp  Negative    26. Crab  Negative    27. Lobster  Negative    28. Oyster  Negative    29. Scallops  Negative    30. Barley  Negative    31. Oat   Negative    32. Rye   Negative    33. Hops  Negative    34. Rice  Negative    35. Cottonseed  Negative    36. Saccharomyces Cerevisiae   Negative    37. Pork  Negative    38. [place] Meat  Negative    39. Chicken Meat  Negative    40. Beef  Negative    41. Lamb  Negative    42. Tomato  Negative    43. White Potato  Negative    44. Sweet Potato  Negative    45. Pea, Green/English  Negative    46. Navy Bean  Negative    47. Mushrooms  Negative    48. Avocado  Negative    49. Onion  Negative    50. Cabbage  Negative    51. Carrots  Negative    52. Celery  Negative    53. Corn  Negative    54. Cucumber  Negative    55. Grape (White seedless)  Negative    56. Orange   Negative    57. Banana  Negative    58. Apple  Negative    59. Peach  Negative    60. Strawberry  Negative    61. Cantaloupe  Negative    62. Watermelon  Negative    63. Pineapple  Negative    64. Chocolate/Cacao bean  Negative    65. Karaya Gum  Negative    66. Acacia (Arabic Gum)  Negative    67. Cinnamon  Negative    68. Nutmeg  Negative    69. Ginger  Negative    70. Garlic  Negative    71. Pepper, black  Negative    72. Mustard  Negative    Comments  Done by [employer]       Past Medical History: Patient Active Problem List   Diagnosis Date Noted  . Chronic urticaria 07/05/2019  . Other allergic rhinitis 07/05/2019  . Adverse food reaction 07/05/2019  . Penicillin allergy 07/05/2019  . 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  . 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   Past Medical History:  Diagnosis Date  . Anemia   . Asthma   . Chronic urticaria 07/05/2019  . 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 ABCESS DRAINAGE    . HERNIA REPAIR    .  TONSILLECTOMY    . TONSILLECTOMY AND ADENOIDECTOMY    . TUMOR REMOVAL     non cancerous  . TYMPANOSTOMY TUBE PLACEMENT     Medication List:  Current Outpatient Medications  Medication Sig Dispense Refill  . albuterol (VENTOLIN HFA) 108 (90 Base) MCG/ACT inhaler Inhale into the lungs.    . cetirizine (ZYRTEC) 10 MG tablet Take 1 tablet (10 mg total) by mouth daily. 90 tablet 0  . escitalopram (LEXAPRO) 10 MG tablet  Take 10 mg by mouth daily.    . famotidine (PEPCID) 40 MG tablet Take by mouth.    . ferrous sulfate 325 (65 FE) MG tablet Take by mouth.    . nystatin (MYCOSTATIN/NYSTOP) powder APPLY TO SKIN IN AFFECTED AREA TWICE DAILY.     No current facility-administered medications for this visit.    Allergies: Allergies  Allergen Reactions  . Citrus Hives and Itching  . Nsaids Other (See Comments)    Cannot take due to blood disorder  . Penicillins Hives and Swelling   Social History: Social History   Socioeconomic History  . Marital status: Single    Spouse name: Not on file  . Number of children: Not on file  . Years of education: Not on file  . Highest education level: Not on file  Occupational History  . Not on file  Social Needs  . Financial resource strain: Not on file  . Food insecurity    Worry: Not on file    Inability: Not on file  . Transportation needs    Medical: Not on file    Non-medical: Not on file  Tobacco Use  . Smoking status: Never Smoker  . Smokeless tobacco: Never Used  Substance and Sexual Activity  . Alcohol use: Not Currently  . Drug use: Never  . Sexual activity: Yes    Birth control/protection: None  Lifestyle  . Physical activity    Days per week: Not on file    Minutes per session: Not on file  . Stress: Not on file  Relationships  . Social Herbalist on phone: Not on file    Gets together: Not on file    Attends religious service: Not on file    Active member of club or organization: Not on file    Attends  meetings of clubs or organizations: Not on file    Relationship status: Not on file  Other Topics Concern  . Not on file  Social History Narrative  . Not on file   Lives in a townhome which is 43.24 years old. Smoking: denies Occupation: unemployed  Environmental HistoryFreight forwarder in the house: no Charity fundraiser in the family room: yes Carpet in the bedroom: yes Heating: electric Cooling: central Pet: no  Family History: Family History  Problem Relation Age of Onset  . Allergic rhinitis Sister   . Allergic rhinitis Maternal Grandmother   . Allergic rhinitis Maternal Grandfather    Review of Systems  Constitutional: Negative for appetite change, chills, fever and unexpected weight change.  HENT: Negative for congestion and rhinorrhea.   Eyes: Negative for itching.  Respiratory: Negative for cough, chest tightness, shortness of breath and wheezing.   Cardiovascular: Negative for chest pain.  Gastrointestinal: Negative for abdominal pain.  Genitourinary: Negative for difficulty urinating.  Skin: Positive for rash.  Neurological: Negative for headaches.   Objective: BP 110/78 (BP Location: Left Arm, Patient Position: Sitting)   Pulse 84   Temp 98.1 F (36.7 C)   Resp 16   Ht 5' 6"  (1.676 m)   Wt 231 lb 6.4 oz (105 kg)   LMP 07/14/2018   SpO2 98%   Breastfeeding Unknown   BMI 37.35 kg/m  Body mass index is 37.35 kg/m. Physical Exam  Constitutional: She is oriented to person, place, and time. She appears well-developed and well-nourished.  HENT:  Head: Normocephalic and atraumatic.  Right Ear: External ear normal.  Left Ear: External ear normal.  Nose: Nose normal.  Mouth/Throat: Oropharynx is clear and moist.  Eyes: Conjunctivae and EOM are normal.  Neck: Neck supple.  Cardiovascular: Normal rate, regular rhythm and normal heart sounds. Exam reveals no gallop and no friction rub.  No murmur heard. Pulmonary/Chest: Effort normal and breath sounds normal. She  has no wheezes. She has no rales.  Abdominal: Soft.  Neurological: She is alert and oriented to person, place, and time.  Skin: Skin is warm. No rash noted.  Psychiatric: She has a normal mood and affect. Her behavior is normal.  Nursing note and vitals reviewed.  The plan was reviewed with the patient/family, and all questions/concerned were addressed.  It was my pleasure to see Brianda today and participate in her care. Please feel free to contact me with any questions or concerns.  Sincerely,  Rexene Alberts, DO Allergy & Immunology  [facility] office: [phone] [facility] office: [place] office: [phone]

## 2019-07-05 NOTE — Assessment & Plan Note (Signed)
History of breaking out in hives and swelling after penicillin ingestion.  Continue avoidance and consider penicillin skin testing in future.

## 2019-07-05 NOTE — Assessment & Plan Note (Signed)
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  Currently taking albuterol as needed less than once a week with good benefit.  Monitor symptoms.  May use albuterol rescue inhaler 2 puffs or nebulizer every 4 to 6 hours as needed for shortness of breath, chest tightness, coughing, and wheezing. May use albuterol rescue inhaler 2 puffs 5 to 15 minutes prior to strenuous physical activities. Monitor frequency of use.   Get spirometry at next visit.

## 2019-07-05 NOTE — Patient Instructions (Addendum)
Today's skin testing was negative to food panel and environmental allergies.  Hives/rash:   Get bloodwork  . Based on clinical history, she likely has chronic idiopathic urticaria. Discussed with patient, that urticaria is usually caused by release of histamine by cutaneous mast cells but sometimes it is non-histamine mediated. Explained that urticaria is not always associated with allergies, and may be related to other infectious or autoimmune causes. In most cases, the exact etiology for urticaria can not be established and it is considered idiopathic. Marland Kitchen Meanwhile start the following medications: zyrtec 10mg  daily. . Avoid the following potential triggers: alcohol, tight clothing, NSAIDs.   Food:  Continue to avoid pineapple, oranges, mango, peaches, plums, shellfish.  For mild symptoms you can take over the counter antihistamines such as Benadryl and monitor symptoms closely. If symptoms worsen or if you have severe symptoms including breathing issues, throat closure, significant swelling, whole body hives, severe diarrhea and vomiting, lightheadedness then seek immediate medical care. Food allergen skin testing has excellent negative predictive value however there is still a 5% chance that the allergy exists. Therefore, we will investigate further with serum specific IgE levels and, if negative then schedule for open graded oral food challenge.  Asthma: . Daily controller medication(s): None . Prior to physical activity: May use albuterol rescue inhaler 2 puffs 5 to 15 minutes prior to strenuous physical activities. Marland Kitchen Rescue medications: May use albuterol rescue inhaler 2 puffs or nebulizer every 4 to 6 hours as needed for shortness of breath, chest tightness, coughing, and wheezing. Monitor frequency of use.  . Asthma control goals:  o Full participation in all desired activities (may need albuterol before activity) o Albuterol use two times or less a week on average (not counting use with  activity) o Cough interfering with sleep two times or less a month o Oral steroids no more than once a year o No hospitalizations  Follow up in 2 months  Skin care recommendations  Bath time: . Always use lukewarm water. AVOID very hot or cold water. Marland Kitchen Keep bathing time to 5-10 minutes. . Do NOT use bubble bath. . Use a mild soap and use just enough to wash the dirty areas. . Do NOT scrub skin vigorously.  . After bathing, pat dry your skin with a towel. Do NOT rub or scrub the skin.  Moisturizers and prescriptions:  . ALWAYS apply moisturizers immediately after bathing (within 3 minutes). This helps to lock-in moisture. . Use the moisturizer several times a day over the whole body. Kermit Balo summer moisturizers include: Aveeno, CeraVe, Cetaphil. Kermit Balo winter moisturizers include: Aquaphor, Vaseline, Cerave, Cetaphil, Eucerin, Vanicream. . When using moisturizers along with medications, the moisturizer should be applied about one hour after applying the medication to prevent diluting effect of the medication or moisturize around where you applied the medications. When not using medications, the moisturizer can be continued twice daily as maintenance.  Laundry and clothing: . Avoid laundry products with added color or perfumes. . Use unscented hypo-allergenic laundry products such as Tide free, Cheer free & gentle, and All free and clear.  . If the skin still seems dry or sensitive, you can try double-rinsing the clothes. . Avoid tight or scratchy clothing such as wool. . Do not use fabric softeners or dyer sheets.

## 2019-07-08 LAB — ALLERGEN HYMENOPTERA PANEL
Bumblebee: <0.1 kU/L
Honeybee IgE: <0.1 kU/L
Hornet, White Face, IgE: <0.1 kU/L
Hornet, Yellow, IgE: <0.1 kU/L
Paper Wasp IgE: <0.1 kU/L
Yellow Jacket, IgE: <0.1 kU/L

## 2019-07-08 LAB — ALLERGEN PROFILE, SHELLFISH
Clam IgE: <0.1 kU/L
F023-IgE Crab: <0.1 kU/L
F080-IgE Lobster: <0.1 kU/L
F290-IgE Oyster: <0.1 kU/L
Scallop IgE: <0.1 kU/L
Shrimp IgE: <0.1 kU/L

## 2019-07-10 LAB — ALLERGEN PEACH F95: Allergen, Peach f95: <0.1 kU/L

## 2019-07-10 LAB — ALPHA-GAL PANEL
Alpha Gal IgE*: <0.1 kU/L (ref <0.10)
Beef (Bos spp) IgE: <0.1 kU/L (ref <0.35)
Class Interpretation: 0
Class Interpretation: 0
Class Interpretation: 0
Lamb/Mutton (Ovis spp) IgE: <0.1 kU/L (ref <0.35)
Pork (Sus spp) IgE: <0.1 kU/L (ref <0.35)

## 2019-07-10 LAB — ALLERGENS W/TOTAL IGE AREA 2

## 2019-07-10 LAB — ANA W/REFLEX: Anti Nuclear Antibody (ANA): NEGATIVE

## 2019-07-10 LAB — C3 AND C4
Complement C3, Serum: 164 mg/dL (ref 82–167)
Complement C4, Serum: 36 mg/dL (ref 14–44)

## 2019-07-10 LAB — THYROID CASCADE PROFILE: TSH: 1.49 u[IU]/mL (ref 0.450–4.500)

## 2019-07-10 LAB — ALLERGEN, PINEAPPLE, F210: Pineapple IgE: <0.1 kU/L

## 2019-07-10 LAB — ALLERGEN, ORANGE F33: Orange: <0.1 kU/L

## 2019-07-10 LAB — TRYPTASE: Tryptase: 3.5 ug/L (ref 2.2–13.2)

## 2019-07-10 LAB — F255-IGE PLUM: F255-IgE Plum: <0.1 kU/L

## 2019-07-10 LAB — ALLERGEN, MANGO, F91: Mango IgE: <0.1 kU/L

## 2019-07-15 ENCOUNTER — Other Ambulatory Visit: Payer: Self-pay | Admitting: *Deleted

## 2019-07-15 MED ORDER — FLUTICASONE PROPIONATE 50 MCG/ACT NA SUSP: NASAL | 5 refills | Status: DC

## 2019-07-15 NOTE — Telephone Encounter (Signed)
Sent in fluticasone per lab result note

## 2019-08-17 IMAGING — US US OB < 14 WEEKS - US OB TV
1 series · 15 of 28 positions shown · non-contrast
Comparison: None.

CLINICAL DATA: Pregnant patient with abdominal pain.

EXAM:
OBSTETRIC <14 WK US AND TRANSVAGINAL OB US
TECHNIQUE: Both transabdominal and transvaginal ultrasound examinations were
performed for complete evaluation of the gestation as well as the
maternal uterus, adnexal regions, and pelvic cul-de-sac.
Transvaginal technique was performed to assess early pregnancy.

[Series 1: us ob < 14 weeks - us ob tv · 15 of 87 slices shown]
[im 1/87]
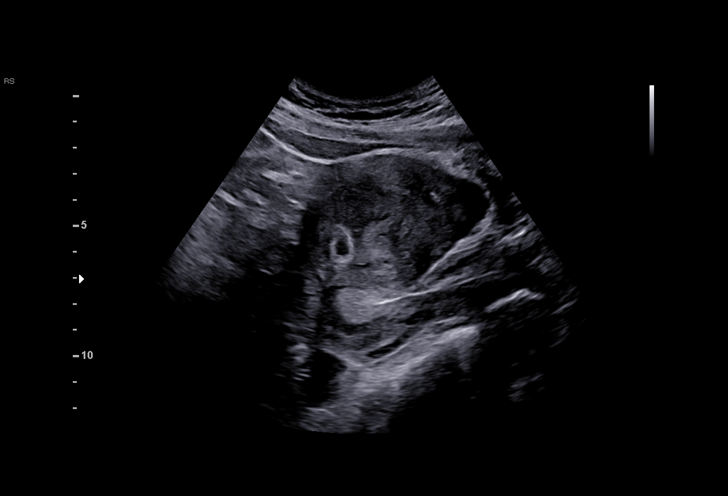
[im 7/87]
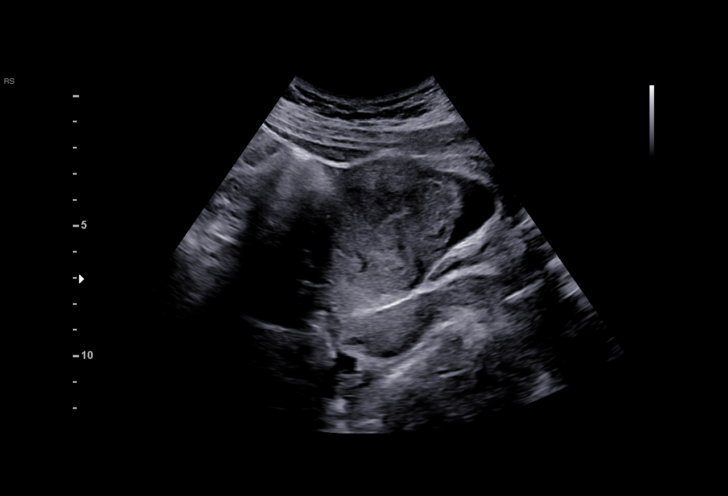
[im 13/87]
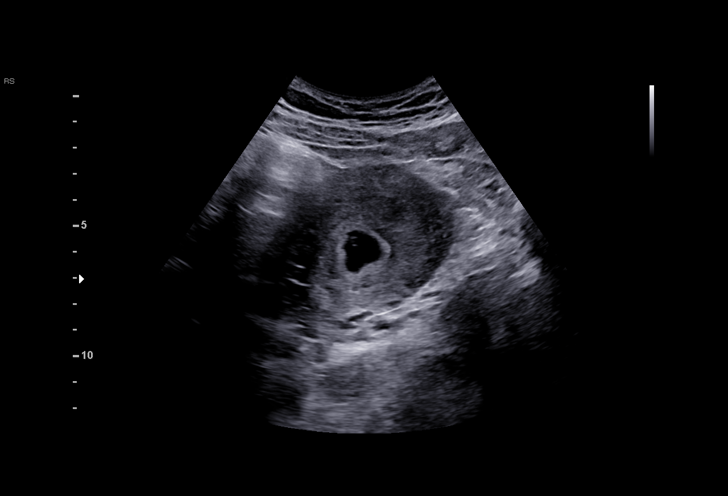
[im 20/87]
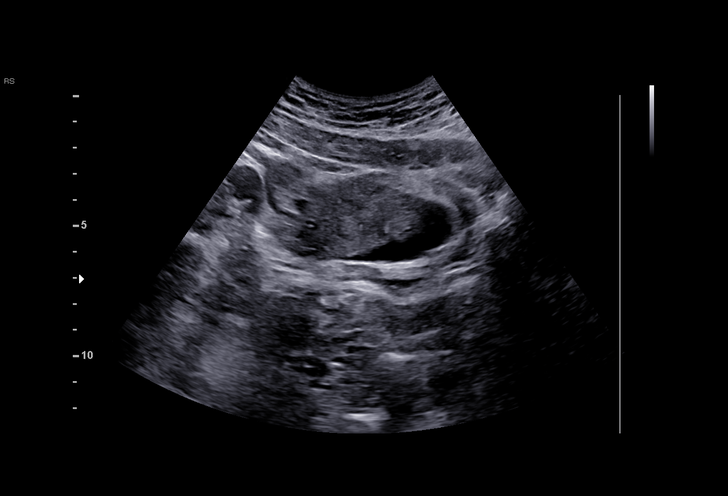
[im 26/87]
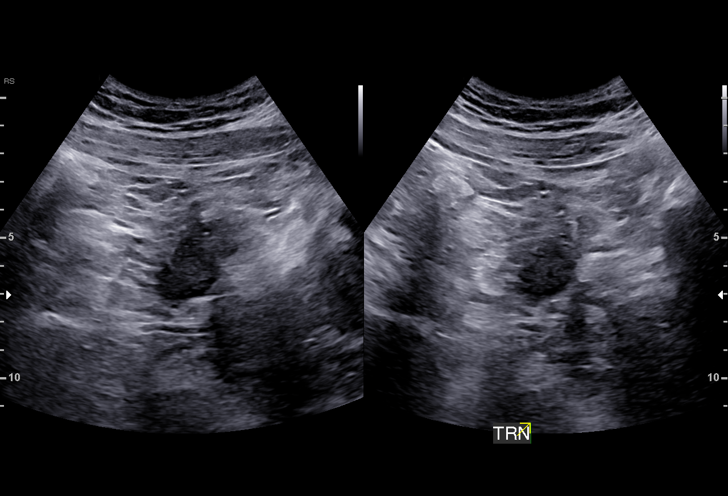
[im 32/87]
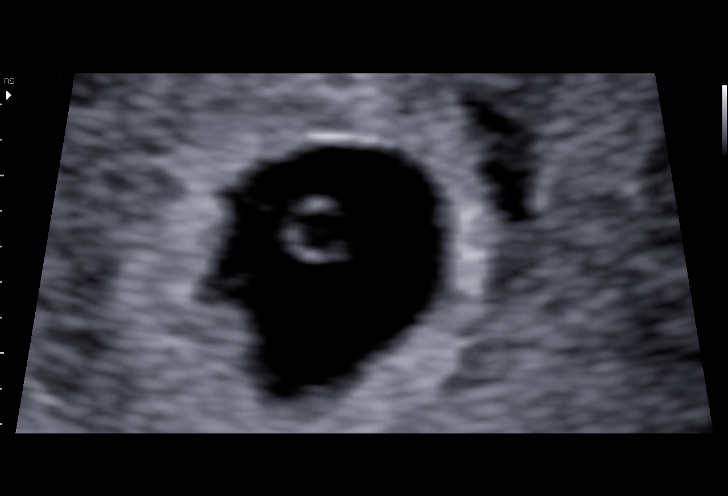
[im 39/87]
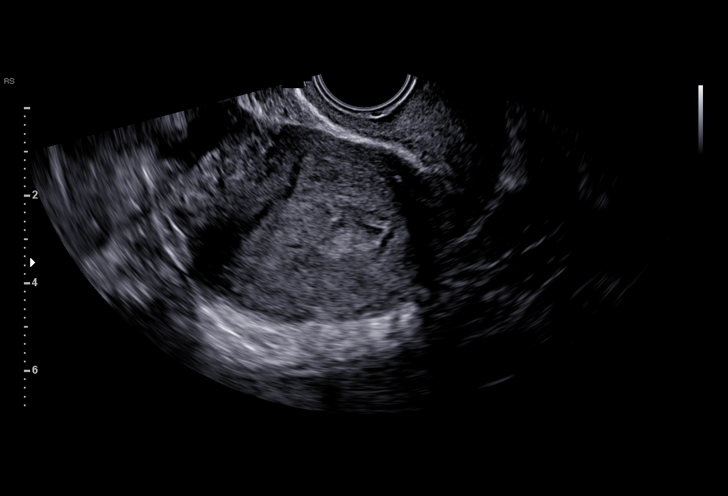
[im 45/87]
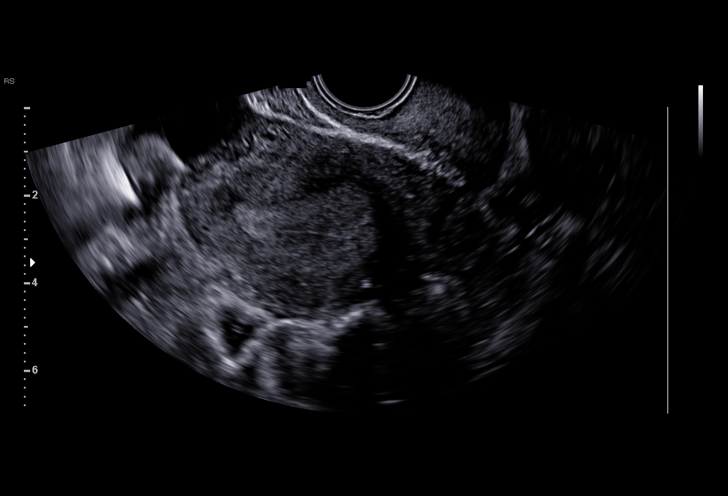
[im 48/87]
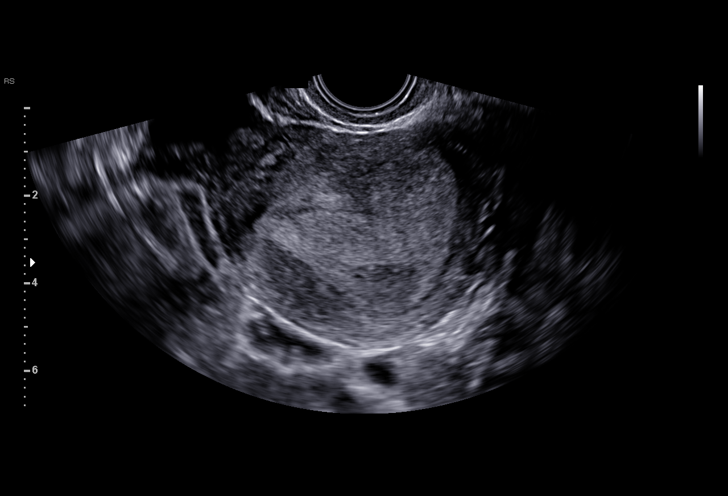
[im 55/87]
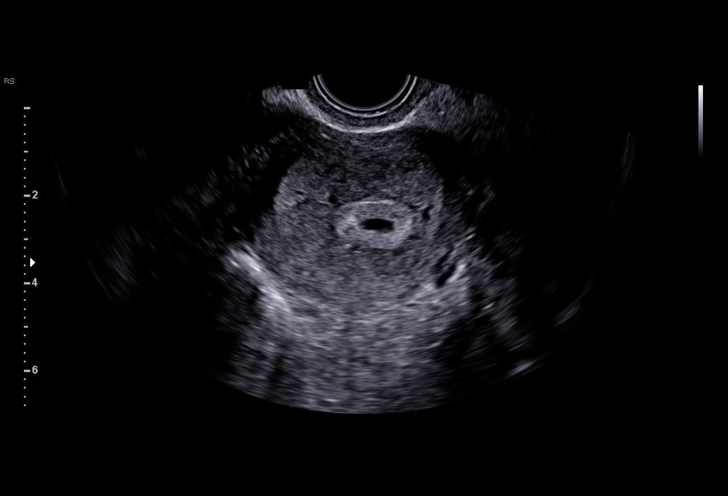
[im 61/87]
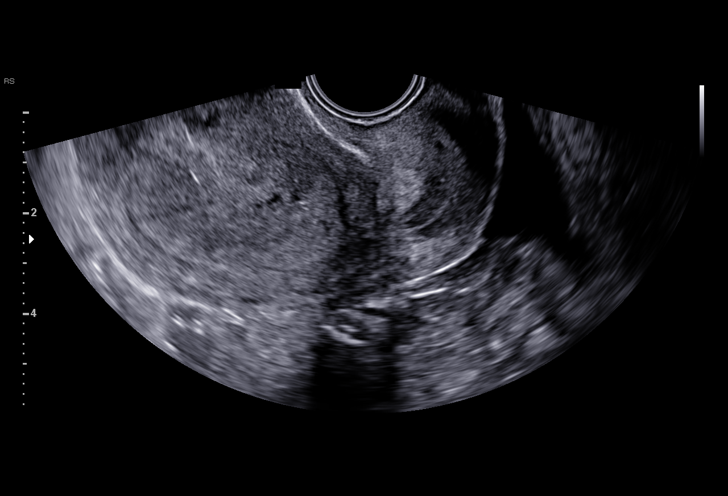
[im 67/87]
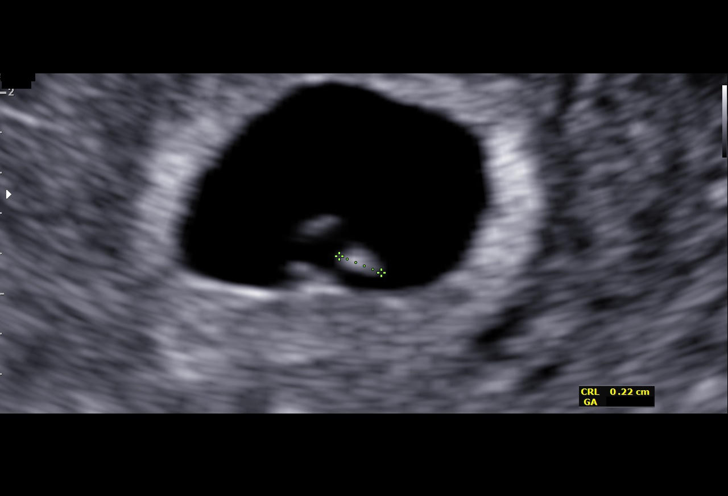
[im 74/87]
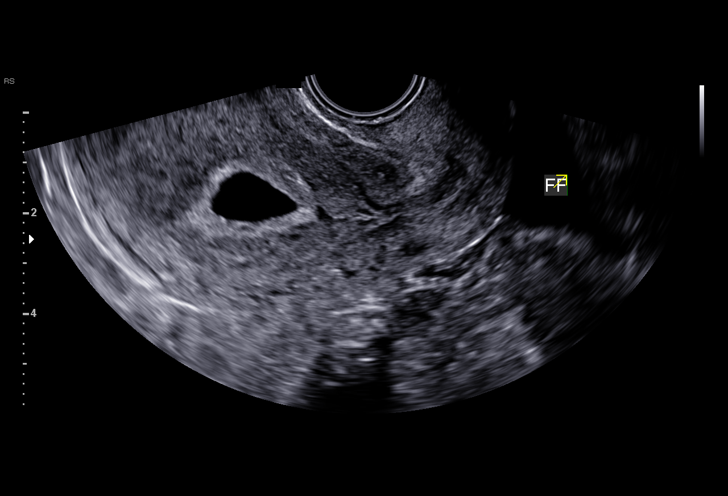
[im 80/87]
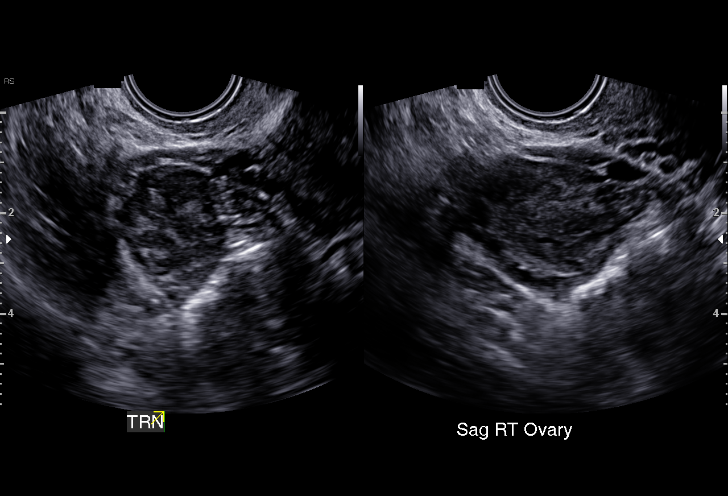
[im 87/87]
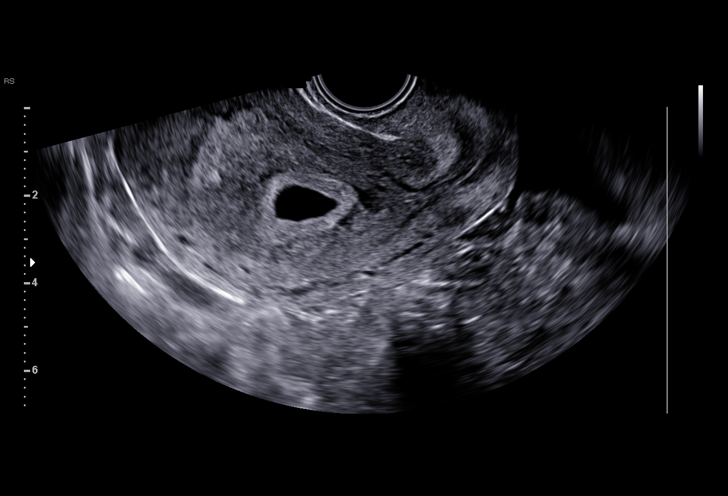

[15 of 28 positions shown; findings below may reference images not displayed]

FINDINGS: Intrauterine gestational sac: Single

Yolk sac:  Visualized.

Embryo:  Visualized.

Cardiac Activity: Visualized.

Heart Rate: 85 bpm

MSD:   mm    w     d

CRL:  2.3 mm   5 w   5 d                  US EDC: April 22, 2019

Subchorionic hemorrhage: A small subchorionic hemorrhage is
identified.

Maternal uterus/adnexae: Corpus luteum cyst is seen in the right
ovary. The left ovary is normal. A small amount of physiologic fluid
is seen in the pelvis.
IMPRESSION: 1. Single live IUP.
2. Small subchorionic hemorrhage.

## 2019-09-06 ENCOUNTER — Ambulatory Visit: Payer: Medicaid Other | Admitting: Allergy

## 2019-09-06 NOTE — Progress Notes (Deleted)
Follow Up Note  RE: Kathleen [name] MRN: [id number] DOB: [birth year] Date of Office Visit: 09/06/2019  Referring provider: Joya Gaskins, * Primary care provider: Joya Gaskins, FNP  Chief Complaint: No chief complaint on file.  History of Present Illness: I had the pleasure of seeing Kathleen [name] for a follow up visit at the [facility] on 09/06/2019. She is a 23 y.o. [name], who is being followed for urticaria, rhinitis, adverse food reaction, exercise-induced bronchospasm, penicillin allergy and hymenoptera allergy. Today she is here for regular follow up visit. Her previous allergy office visit was on 07/05/2019 with Dr. Maudie Mercury.   Chronic urticaria Breaking out in pruritic hives for the past 3 months.  No triggers noted.  June and May 2020 bloodwork - CBC diff, CMP, ESR unremarkable. Crp elevated.  Good benefit with taking Zyrtec 10 mg daily.  Today's skin testing was negative to food panel and environmental allergies.  Get bloodwork as below to rule out any other etiologies.  Based on clinical history, she likely has chronic idiopathic urticaria. Discussed with patient, that urticaria is usually caused by release of histamine by cutaneous mast cells but sometimes it is non-histamine mediated. Explained that urticaria is not always associated with allergies, and may be related to other infectious or autoimmune causes. In most cases, the exact etiology for urticaria can not be established and it is considered idiopathic.  Meanwhile start the following medications: zyrtec 25m daily.  Avoid the following potential triggers: alcohol, tight clothing, NSAIDs.   Other allergic rhinitis Rhinitis symptoms during the spring for the past 20 years.  Patient had allergy skin testing over 10 years ago and showed multiple positives per patient report.   Today skin testing was negative to environmental allergies.  Monitor symptoms.   Adverse food  reaction Currently avoiding pineapples, oranges, mango, peaches, plums and shellfish.  Fish caused hives and throat tightness in the past.  Fresh foods cause coughing, sneezing and hives.  Today skin testing was negative to food panel.  Continue to avoid pineapple, oranges, mango, peaches, plums, shellfish.  For mild symptoms you can take over the counter antihistamines such as Benadryl and monitor symptoms closely. If symptoms worsen or if you have severe symptoms including breathing issues, throat closure, significant swelling, whole body hives, severe diarrhea and vomiting, lightheadedness then seek immediate medical care.  Food allergen skin testing has excellent negative predictive value however there is still a 5% chance that the allergy exists. Therefore, we will investigate further with serum specific IgE levels and, if negative then schedule for open graded oral food challenge.  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  Currently taking albuterol as needed less than once a week with good benefit.  Monitor symptoms.  May use albuterol rescue inhaler 2 puffs or nebulizer every 4 to 6 hours as needed for shortness of breath, chest tightness, coughing, and wheezing. May use albuterol rescue inhaler 2 puffs 5 to 15 minutes prior to strenuous physical activities. Monitor frequency of use.   Get spirometry at next visit.  Penicillin allergy History of breaking out in hives and swelling after penicillin ingestion.  Continue avoidance and consider penicillin skin testing in future.  Hymenoptera allergy Facial swelling after sting in the past. No previous work up.  Get bloodwork.  Avoid insect/bug stings.  For mild symptoms you can take over the counter antihistamines such as Benadryl and monitor symptoms closely. If symptoms worsen or if you have severe  symptoms including breathing issues, throat closure, significant swelling, whole body  hives, severe diarrhea and vomiting, lightheadedness then seek immediate medical care.  Return in about 2 months (around 09/05/2019).  Please call patient. Reviewed bloodwork. Everything came back normal - thyroid, negative alpha gal, autoimmune screener. The rash/hives are most likely idiopathic meaning no cause. Continue taking zyrtec 33m daily. Bloodwork was also negative to environmental allergy panel. If still having issues with the sinuses, then use Flonase 1 spray per nostril 1-2 times a day as needed. Regarding the foods - negative to peach, pineapple, orange, mango, plum and shellfish.  Continue to avoid pineapple, oranges, mango, peaches, plums, shellfish.  If interested we can schedule food challenges to the above foods as there is still a small chance of reaction even with the negative work up. Each food must be done on separate occasions. You must be off antihistamines for 3-5 days before. Plan on being in the office for 2-3 hours and must bring in the food you want to do the oral challenge for. You must call to scheduled an appointment and specify it's for a food challenge.  Hymenoptera allergy - negative to honeybee, wasp, yellow jackets, white faced hornets, yellow hornet, bumblebee.  Assessment and Plan: Kathleen a 23y.o. [name] with: No problem-specific Assessment & Plan notes found for this encounter.  No follow-ups on file.  No orders of the defined types were placed in this encounter.  Lab Orders  No laboratory test(s) ordered today    Diagnostics: Spirometry:  Tracings reviewed. Her effort: {Blank single:19197::"Good reproducible efforts.","It was hard to get consistent efforts and there is a question as to whether this reflects a maximal maneuver.","Poor effort, data can not be interpreted."} FVC: ***L FEV1: ***L, ***% predicted FEV1/FVC ratio: ***% Interpretation: {Blank single:19197::"Spirometry consistent with mild obstructive disease","Spirometry consistent  with moderate obstructive disease","Spirometry consistent with severe obstructive disease","Spirometry consistent with possible restrictive disease","Spirometry consistent with mixed obstructive and restrictive disease","Spirometry uninterpretable due to technique","Spirometry consistent with normal pattern","No overt abnormalities noted given today's efforts"}.  Please see scanned spirometry results for details.  Skin Testing: {Blank single:19197::"Select foods","Environmental allergy panel","Environmental allergy panel and select foods","Food allergy panel","None","Deferred due to recent antihistamines use"}. Positive test to: ***. Negative test to: ***.  Results discussed with patient/family.   Medication List:  Current Outpatient Medications  Medication Sig Dispense Refill   albuterol (VENTOLIN HFA) 108 (90 Base) MCG/ACT inhaler Inhale into the lungs.     cetirizine (ZYRTEC) 10 MG tablet Take 1 tablet (10 mg total) by mouth daily. 90 tablet 0   escitalopram (LEXAPRO) 10 MG tablet Take 10 mg by mouth daily.     famotidine (PEPCID) 40 MG tablet Take by mouth.     ferrous sulfate 325 (65 FE) MG tablet Take by mouth.     fluticasone (FLONASE) 50 MCG/ACT nasal spray 1 spray per nostril 1-2 times a day as needed 16 g 5   nystatin (MYCOSTATIN/NYSTOP) powder APPLY TO SKIN IN AFFECTED AREA TWICE DAILY.     No current facility-administered medications for this visit.    Allergies: Allergies  Allergen Reactions   Citrus Hives and Itching   Nsaids Other (See Comments)    Cannot take due to blood disorder   Penicillins Hives and Swelling   Latex    Tea Tree Oil    I reviewed her past medical history, social history, family history, and environmental history and no significant changes have been reported from previous visit on 07/05/2019.  Review of Systems  Constitutional: Negative for appetite change, chills, fever and unexpected weight change.  HENT: Negative for congestion and  rhinorrhea.   Eyes: Negative for itching.  Respiratory: Negative for cough, chest tightness, shortness of breath and wheezing.   Cardiovascular: Negative for chest pain.  Gastrointestinal: Negative for abdominal pain.  Genitourinary: Negative for difficulty urinating.  Skin: Positive for rash.  Neurological: Negative for headaches.   Objective: There were no vitals taken for this visit. There is no height or weight on file to calculate BMI. Physical Exam  Constitutional: She is oriented to person, place, and time. She appears well-developed and well-nourished.  HENT:  Head: Normocephalic and atraumatic.  Right Ear: External ear normal.  Left Ear: External ear normal.  Nose: Nose normal.  Mouth/Throat: Oropharynx is clear and moist.  Eyes: Conjunctivae and EOM are normal.  Neck: Neck supple.  Cardiovascular: Normal rate, regular rhythm and normal heart sounds. Exam reveals no gallop and no friction rub.  No murmur heard. Pulmonary/Chest: Effort normal and breath sounds normal. She has no wheezes. She has no rales.  Abdominal: Soft.  Neurological: She is alert and oriented to person, place, and time.  Skin: Skin is warm. No rash noted.  Psychiatric: She has a normal mood and affect. Her behavior is normal.  Nursing note and vitals reviewed.  Previous notes and tests were reviewed. The plan was reviewed with the patient/family, and all questions/concerned were addressed.  It was my pleasure to see Kathleen [name] today and participate in her care. Please feel free to contact me with any questions or concerns.  Sincerely,  Rexene Alberts, DO Allergy & Immunology  [facility] office: [phone] [facility] office: [place] office: [phone]

## 2021-04-19 ENCOUNTER — Other Ambulatory Visit: Payer: Self-pay

## 2021-04-19 ENCOUNTER — Emergency Department (HOSPITAL_BASED_OUTPATIENT_CLINIC_OR_DEPARTMENT_OTHER)
Admission: EM | Admit: 2021-04-19 | Discharge: 2021-04-19 | Disposition: A | Discharge status: Home / Self Care | Payer: Medicaid Other | Attending: Emergency Medicine | Admitting: Emergency Medicine

## 2021-04-19 ENCOUNTER — Encounter (HOSPITAL_BASED_OUTPATIENT_CLINIC_OR_DEPARTMENT_OTHER): Payer: Self-pay | Admitting: Emergency Medicine

## 2021-04-19 DIAGNOSIS — Z91040 Latex allergy status: Secondary | ICD-10-CM | POA: Diagnosis not present

## 2021-04-19 DIAGNOSIS — J45909 Unspecified asthma, uncomplicated: Secondary | ICD-10-CM | POA: Insufficient documentation

## 2021-04-19 DIAGNOSIS — Z28310 Unvaccinated for covid-19: Secondary | ICD-10-CM | POA: Insufficient documentation

## 2021-04-19 DIAGNOSIS — U071 COVID-19: Secondary | ICD-10-CM | POA: Diagnosis not present

## 2021-04-19 DIAGNOSIS — Z7951 Long term (current) use of inhaled steroids: Secondary | ICD-10-CM | POA: Insufficient documentation

## 2021-04-19 DIAGNOSIS — R0602 Shortness of breath: Secondary | ICD-10-CM | POA: Diagnosis present

## 2021-04-19 MED ORDER — PAXLOVID 20 X 150 MG & 10 X 100MG PO TBPK: 1.0000 | ORAL_TABLET | Freq: Two times a day (BID) | ORAL | 0 refills | Status: AC

## 2021-04-19 MED ORDER — HYDROCODONE BIT-HOMATROP MBR 5-1.5 MG/5ML PO SOLN: 5.0000 mL | Freq: Four times a day (QID) | ORAL | 0 refills | Status: DC | PRN

## 2021-04-19 NOTE — ED Provider Notes (Signed)
[facility] EMERGENCY DEPT Provider Note   CSN: [id number] Arrival date & time: 04/19/21  1143     History Chief Complaint  Patient presents with  . Shortness of Breath    Kathleen [name] is a 25 y.o. female.  The history is provided by the patient.  Shortness of Breath Severity:  Moderate Onset quality:  Gradual Duration:  4 days Timing:  Constant Progression:  Worsening Chronicity:  New Context: URI   Context comment:  Tested positive for COVID yesterday Relieved by:  Nothing Worsened by:  Nothing Ineffective treatments:  Rest Associated symptoms: cough, fever and sputum production   Associated symptoms: no abdominal pain, no chest pain, no ear pain, no rash, no sore throat and no vomiting   Risk factors comment:  Unvaccinated for COVID      Past Medical History:  Diagnosis Date  . Anemia   . Asthma   . Chronic urticaria 07/05/2019  . 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 Chronic urticaria 07/05/2019  . Other allergic rhinitis 07/05/2019  . Adverse food reaction 07/05/2019  . Penicillin allergy 07/05/2019  . 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  . 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    Past Surgical History:  Procedure Laterality Date  . ABCESS DRAINAGE    . HERNIA REPAIR    . TONSILLECTOMY    . TONSILLECTOMY AND ADENOIDECTOMY    . TUMOR REMOVAL     non cancerous  . TYMPANOSTOMY TUBE PLACEMENT       OB History    Gravida  1   Para      Term      Preterm      AB      Living        SAB      IAB      Ectopic      Multiple      Live Births              Family History  Problem Relation Age of Onset  . Allergic rhinitis Sister   . Allergic rhinitis Maternal Grandmother   . Allergic rhinitis Maternal Grandfather     Social History   Tobacco Use  . Smoking status: Never Smoker  . Smokeless tobacco: Never Used  Vaping Use  . Vaping Use: Never used   Substance Use Topics  . Alcohol use: Not Currently  . Drug use: Never    Home Medications Prior to Admission medications   Medication Sig Start Date End Date Taking? Authorizing Provider  albuterol (VENTOLIN HFA) 108 (90 Base) MCG/ACT inhaler Inhale into the lungs. 04/23/19   [provider]  cetirizine (ZYRTEC) 10 MG tablet Take 1 tablet (10 mg total) by mouth daily. 07/05/19 08/04/19  Ellamae Sia, DO  escitalopram (LEXAPRO) 10 MG tablet Take 10 mg by mouth daily. 05/25/19   [provider]  famotidine (PEPCID) 40 MG tablet Take by mouth. 06/17/19 07/17/19  [provider]  ferrous sulfate 325 (65 FE) MG tablet Take by mouth. 06/17/19 07/17/19  [provider]  fluticasone (FLONASE) 50 MCG/ACT nasal spray 1 spray per nostril 1-2 times a day as needed 07/15/19   Ellamae Sia, DO  nystatin (MYCOSTATIN/NYSTOP) powder APPLY TO SKIN IN AFFECTED AREA TWICE DAILY. 03/23/19   [provider]    Allergies    Citrus, Nsaids, Penicillins, Latex, and Tea tree oil  Review of Systems   Review of Systems  Constitutional: Positive for fever. Negative for chills.  HENT: Positive for rhinorrhea. Negative for ear pain and sore throat.   Eyes: Negative for pain and visual disturbance.  Respiratory: Positive for cough, sputum production and shortness of breath.   Cardiovascular: Negative for chest pain and palpitations.  Gastrointestinal: Negative for abdominal pain and vomiting.  Genitourinary: Negative for dysuria and hematuria.  Musculoskeletal: Negative for arthralgias and back pain.  Skin: Negative for color change and rash.  Neurological: Negative for seizures and syncope.  All other systems reviewed and are negative.   Physical Exam Updated Vital Signs BP 122/89 (BP Location: Right Arm)   Pulse 98   Temp 99.4 F (37.4 C) (Oral)   Resp 20   Ht 5\' 6"  (1.676 m)   Wt 105 kg   LMP 03/23/2021 (Approximate)   SpO2 99%   BMI 37.36 kg/m   Physical  Exam Vitals and nursing note reviewed.  Constitutional:      General: She is not in acute distress.    Appearance: She is well-developed.  HENT:     Head: Normocephalic and atraumatic.  Eyes:     Conjunctiva/sclera: Conjunctivae normal.  Cardiovascular:     Rate and Rhythm: Normal rate and regular rhythm.     Heart sounds: No murmur heard.   Pulmonary:     Effort: Pulmonary effort is normal. No respiratory distress.     Breath sounds: Normal breath sounds.  Musculoskeletal:     Cervical back: Neck supple.  Skin:    General: Skin is warm and dry.  Neurological:     General: No focal deficit present.     Mental Status: She is alert.  Psychiatric:        Mood and Affect: Mood normal.     ED Results / Procedures / Treatments   Labs (all labs ordered are listed, but only abnormal results are displayed) Labs Reviewed - No data to display  EKG None  Radiology No results found.  Procedures Procedures   Medications Ordered in ED Medications - No data to display  ED Course  I have reviewed the triage vital signs and the nursing notes.  Pertinent labs & imaging results that were available during my care of the patient were reviewed by me and considered in my medical decision making (see chart for details).    MDM Rules/Calculators/A&P                          05/23/2021 presents with a known COVID-19 diagnosis as well as a complaint of shortness of breath.  O2 sat is normal.  No wheezing on exam.  She is unvaccinated, and her asthma as well as body mass index put her at high risk for serious disease progression.  I did counsel her that Paxlovid is a treatment that could help.  She was a little bit skeptical, but she did speak with her mother, and she agreed to take it.  She understands that today is the last day that she can start this medicine and expect any benefit.  She was given return precautions as well as a cough syrup. Final Clinical Impression(s) / ED  Diagnoses Final diagnoses:  COVID-19    Rx / DC Orders ED Discharge Orders         Ordered    HYDROcodone bit-homatropine (HYCODAN) 5-1.5 MG/5ML syrup  Every 6 hours PRN        04/19/21 1245  Nirmatrelvir & Ritonavir (PAXLOVID) 20 x 150 MG & 10 x 100MG  TBPK  2 times daily        04/19/21 1246           04/21/21, MD 04/19/21 1248

## 2021-04-19 NOTE — ED Triage Notes (Signed)
Pt via pov from home with sob since yesterday. Pt had positive covid test yesterday, symptoms began on Sunday. Pt has hx of asthma. Pt alert & oriented, nad noted.

## 2021-04-22 ENCOUNTER — Encounter (HOSPITAL_COMMUNITY): Payer: Self-pay | Admitting: Obstetrics and Gynecology

## 2021-04-22 ENCOUNTER — Inpatient Hospital Stay (HOSPITAL_COMMUNITY)
Admission: AD | Admit: 2021-04-22 | Discharge: 2021-04-23 | Disposition: A | Discharge status: Home / Self Care | Payer: Medicaid Other | Attending: Obstetrics and Gynecology | Admitting: Obstetrics and Gynecology

## 2021-04-22 ENCOUNTER — Inpatient Hospital Stay (HOSPITAL_COMMUNITY): Payer: Medicaid Other

## 2021-04-22 ENCOUNTER — Other Ambulatory Visit: Payer: Self-pay

## 2021-04-22 DIAGNOSIS — O99111 Other diseases of the blood and blood-forming organs and certain disorders involving the immune mechanism complicating pregnancy, first trimester: Secondary | ICD-10-CM | POA: Diagnosis not present

## 2021-04-22 DIAGNOSIS — D66 Hereditary factor VIII deficiency: Secondary | ICD-10-CM | POA: Insufficient documentation

## 2021-04-22 DIAGNOSIS — Z880 Allergy status to penicillin: Secondary | ICD-10-CM | POA: Diagnosis not present

## 2021-04-22 DIAGNOSIS — O26899 Other specified pregnancy related conditions, unspecified trimester: Secondary | ICD-10-CM

## 2021-04-22 DIAGNOSIS — O3680X0 Pregnancy with inconclusive fetal viability, not applicable or unspecified: Secondary | ICD-10-CM

## 2021-04-22 DIAGNOSIS — Z3A01 Less than 8 weeks gestation of pregnancy: Secondary | ICD-10-CM | POA: Diagnosis not present

## 2021-04-22 DIAGNOSIS — Z886 Allergy status to analgesic agent status: Secondary | ICD-10-CM | POA: Diagnosis not present

## 2021-04-22 DIAGNOSIS — R109 Unspecified abdominal pain: Secondary | ICD-10-CM | POA: Insufficient documentation

## 2021-04-22 DIAGNOSIS — Z79899 Other long term (current) drug therapy: Secondary | ICD-10-CM | POA: Diagnosis not present

## 2021-04-22 DIAGNOSIS — O26891 Other specified pregnancy related conditions, first trimester: Secondary | ICD-10-CM | POA: Insufficient documentation

## 2021-04-22 LAB — URINALYSIS, ROUTINE W REFLEX MICROSCOPIC
Bilirubin Urine: NEGATIVE
Glucose, UA: NEGATIVE mg/dL
Hgb urine dipstick: NEGATIVE
Ketones, ur: NEGATIVE mg/dL
Leukocytes,Ua: NEGATIVE
Nitrite: NEGATIVE
Protein, ur: NEGATIVE mg/dL
Specific Gravity, Urine: 1.031 — ABNORMAL HIGH (ref 1.005–1.030)
pH: 5 (ref 5.0–8.0)

## 2021-04-22 LAB — CBC
HCT: 36.6 % (ref 36.0–46.0)
Hemoglobin: 11.8 g/dL — ABNORMAL LOW (ref 12.0–15.0)
MCH: 27.2 pg (ref 26.0–34.0)
MCHC: 32.2 g/dL (ref 30.0–36.0)
MCV: 84.3 fL (ref 80.0–100.0)
Platelets: 290 10*3/uL (ref 150–400)
RBC: 4.34 MIL/uL (ref 3.87–5.11)
RDW: 12.6 % (ref 11.5–15.5)
WBC: 9.2 10*3/uL (ref 4.0–10.5)
nRBC: 0 % (ref 0.0–0.2)

## 2021-04-22 LAB — WET PREP, GENITAL
Clue Cells Wet Prep HPF POC: NONE SEEN
Sperm: NONE SEEN
Trich, Wet Prep: NONE SEEN
Yeast Wet Prep HPF POC: NONE SEEN

## 2021-04-22 LAB — POCT PREGNANCY, URINE: Preg Test, Ur: POSITIVE — AB

## 2021-04-22 NOTE — MAU Note (Signed)
..  Kathleen [name] is a 25 y.o. at Unknown here in MAU reporting: Constant mid abdominal pain that began yesterday. Denies vaginal bleeding. Patient reports she tested positive for COVID on Monday.  Positive home pregnancy test today.  LMP: 03/23/21 Pain score: 7/10 Vitals:   04/22/21 2202  BP: 121/77  Pulse: 87  Resp: 18  Temp: 98.8 F (37.1 C)  SpO2: 99%     Lab orders placed from triage: UPT

## 2021-04-22 NOTE — MAU Provider Note (Signed)
History     CSN: [id number]  Arrival date and time: 04/22/21 2147   Event Date/Time   First Provider Initiated Contact with Patient 04/22/21 2250      Chief Complaint  Patient presents with  . Abdominal Pain   Kathleen [name] is a 25 y.o. G4P1031 at [redacted]w[redacted]d by Definite LMP.  She presents today for Abdominal Pain.  She states it started yesterday around 2pm.  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  She rates the pain a 71/0 and has not identified any relieving factors, but states laying down aggravates the pain.  She denies sexual activity in the past 24 hours.    OB History    Gravida  4   Para  1   Term  1   Preterm      AB  3   Living  1     SAB  3   IAB      Ectopic      Multiple  1   Live Births  1           Past Medical History:  Diagnosis Date  . Anemia   . Asthma   . Chronic urticaria 07/05/2019  . 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 ABCESS DRAINAGE    . HERNIA REPAIR    . TONSILLECTOMY    . TONSILLECTOMY AND ADENOIDECTOMY    . TUMOR REMOVAL     non cancerous  . TYMPANOSTOMY TUBE PLACEMENT      Family History  Problem Relation Age of Onset  . Allergic rhinitis Sister   . Allergic rhinitis Maternal Grandmother   . Allergic rhinitis Maternal Grandfather     Social History   Tobacco Use  . Smoking status: Never Smoker  . Smokeless tobacco: Never Used  Vaping Use  . Vaping Use: Never used  Substance Use Topics  . Alcohol use: Not Currently  . Drug use: Never    Allergies:  Allergies  Allergen Reactions  . Citrus Hives and Itching  . Nsaids Other (See Comments)    Cannot take due to blood disorder  . Penicillins Hives and Swelling   . Latex   . Tea Tree Oil     Medications Prior to Admission  Medication Sig Dispense Refill Last Dose  . albuterol (VENTOLIN HFA) 108 (90 Base) MCG/ACT inhaler Inhale into the lungs.   Unknown at Unknown time  . cetirizine (ZYRTEC) 10 MG tablet Take 1 tablet (10 mg total) by mouth daily. 90 tablet 0   . escitalopram (LEXAPRO) 10 MG tablet Take 10 mg by mouth daily.   Unknown at Unknown time  . famotidine (PEPCID) 40 MG tablet Take by mouth.     . ferrous sulfate 325 (65 FE) MG tablet Take by mouth.     . fluticasone (FLONASE) 50 MCG/ACT nasal spray 1 spray per nostril 1-2 times a day as needed 16 g 5 Unknown at Unknown time  . HYDROcodone bit-homatropine (HYCODAN) 5-1.5 MG/5ML syrup Take 5 mLs by mouth every 6 (six) hours as needed for cough. 120 mL 0 Unknown at Unknown time  . Nirmatrelvir & Ritonavir (PAXLOVID) 20 x 150 MG & 10 x  100MG  TBPK Take 1 tablet by mouth in the morning and at bedtime for 5 days. 10 tablet 0 Unknown at Unknown time  . nystatin (MYCOSTATIN/NYSTOP) powder APPLY TO SKIN IN AFFECTED AREA TWICE DAILY.   Unknown at Unknown time    Review of Systems  Constitutional: Negative for chills and fever.  Respiratory: Negative for cough.   Gastrointestinal: Positive for abdominal pain and nausea. Negative for vomiting.  Genitourinary: Positive for vaginal bleeding ("Streaks of blood" earlier with wiping, none currently.). Negative for difficulty urinating, dysuria and vaginal discharge.  Neurological: Negative for dizziness, light-headedness and headaches.   Physical Exam   Blood pressure 121/77, pulse 87, temperature 98.8 F (37.1 C), temperature source Oral, resp. rate 18, height 5\' 6"  (1.676 m), weight 106.3 kg, last menstrual period 03/23/2021, SpO2 99 %, unknown if currently breastfeeding.  Physical Exam Constitutional:      Appearance: She is well-developed.  HENT:     Head: Normocephalic and atraumatic.  Eyes:     Conjunctiva/sclera: Conjunctivae normal.   Cardiovascular:     Rate and Rhythm: Normal rate and regular rhythm.  Pulmonary:     Effort: Pulmonary effort is normal. No respiratory distress.  Abdominal:     Palpations: Abdomen is soft.     Tenderness: There is abdominal tenderness in the right lower quadrant.  Musculoskeletal:        General: Normal range of motion.     Cervical back: Normal range of motion.  Skin:    General: Skin is warm and dry.  Neurological:     Mental Status: She is alert and oriented to person, place, and time.  Psychiatric:        Mood and Affect: Mood normal.        Behavior: Behavior normal.        Thought Content: Thought content normal.     MAU Course  Procedures Results for orders placed or performed during the hospital encounter of 04/22/21 (from the past 24 hour(s))  Urinalysis, Routine w reflex microscopic     Status: Abnormal   Collection Time: 04/22/21 10:16 PM  Result Value Ref Range   Color, Urine YELLOW YELLOW   APPearance HAZY (A) CLEAR   Specific Gravity, Urine 1.031 (H) 1.005 - 1.030   pH 5.0 5.0 - 8.0   Glucose, UA NEGATIVE NEGATIVE mg/dL   Hgb urine dipstick NEGATIVE NEGATIVE   Bilirubin Urine NEGATIVE NEGATIVE   Ketones, ur NEGATIVE NEGATIVE mg/dL   Protein, ur NEGATIVE NEGATIVE mg/dL   Nitrite NEGATIVE NEGATIVE   Leukocytes,Ua NEGATIVE NEGATIVE  Pregnancy, urine POC     Status: Abnormal   Collection Time: 04/22/21 10:17 PM  Result Value Ref Range   Preg Test, Ur POSITIVE (A) NEGATIVE  Wet prep, genital     Status: Abnormal   Collection Time: 04/22/21 10:40 PM  Result Value Ref Range   Yeast Wet Prep HPF POC NONE SEEN NONE SEEN   Trich, Wet Prep NONE SEEN NONE SEEN   Clue Cells Wet Prep HPF POC NONE SEEN NONE SEEN   WBC, Wet Prep HPF POC MANY (A) NONE SEEN   Sperm NONE SEEN   CBC     Status: Abnormal   Collection Time: 04/22/21 10:43 PM  Result Value Ref Range   WBC 9.2 4.0 - 10.5 K/uL   RBC 4.34 3.87 - 5.11 MIL/uL   Hemoglobin 11.8 (L) 12.0 - 15.0 g/dL   HCT  06/22/21 06/22/21 - 02.5 %   MCV 84.3 80.0 -  100.0 fL   MCH 27.2 26.0 - 34.0 pg   MCHC 32.2 30.0 - 36.0 g/dL   RDW 75.1 70.0 - 17.4 %   Platelets 290 150 - 400 K/uL   nRBC 0.0 0.0 - 0.2 %   US OB LESS THAN 14 WEEKS WITH OB TRANSVAGINAL  Result Date: 04/23/2021 CLINICAL DATA:  Mid abdominal pain for 2 days, no vaginal bleeding, quantitative beta HCG = 420 EXAM: OBSTETRIC <14 WK [place] AND TRANSVAGINAL OB US TECHNIQUE: Both transabdominal and transvaginal ultrasound examinations were performed for complete evaluation of the gestation as well as the maternal uterus, adnexal regions, and pelvic cul-de-sac. Transvaginal technique was performed to assess early pregnancy. COMPARISON:  Obstetrical ultrasound 03/09/2019 FINDINGS: Intrauterine gestational sac: None Yolk sac:  Not Visualized. Embryo:  Not Visualized. Cardiac Activity: Not Visualized. Maternal uterus/adnexae: Anteverted maternal uterus. Small volume of fluid is seen in the fundal endometrium. Small volume of non organized fluid is seen within the fundal endometrium. Normal appearance of the ovaries with a probable corpus luteum in the right ovary. Moderate volume of free fluid in the pelvis is anechoic, nonspecific. No focal mass or discretely identifiable ectopic is seen. IMPRESSION: Small amount of non organized fluid is seen within the endometrial canal, unlikely to reflect a discrete gestational sac. No intrauterine pregnancy visualized. Differential considerations would include early intrauterine pregnancy too early to visualize, spontaneous abortion, or occult ectopic pregnancy. Recommend close clinical followup and serial quantitative beta HCGs and ultrasounds. Moderate volume of anechoic free fluid in the deep pelvis is nonspecific, though somewhat greater than expected for normal physiologic fluid. Electronically Signed   By: Kreg Shropshire M.D.   On: 04/23/2021 00:49   MDM Physical Exam Wet Prep and GC/CT Labs: UA, UPT, CBC, hCG,  ABO Ultrasound Assessment and Plan  25 year old G4P1031 at 4.2 weeks Abdominal Pain Nausea  -Labs ordered and cultures collected prior to provider at bedside. -Reviewed POC with patient. -Patient offered and declines pain medication. -Patient offered and declines antiemetic. -Patient questions normalcy of pain during pregnancy.  Reassured that abdominal pain/cramping can be frequent and normal occurrence.  -Cautioned that [place] will likely show no IUP consider early pregnancy.  -Reviewed need for follow up in 48 hours and patient desires to follow up with [facility].  Will schedule accordingly.  -Will order bedside US and await results.   Cherre Robins 04/22/2021, 10:50 PM   Reassessment (12:55 AM)  -Results return as above. -Patient informed that US shows no IUP and reassured of normalcy d/t GA. -Discussed need for follow up and patient scheduled for Wednesday May 4th at 0800 at [facility]. -Patient questions pregnancy safe medications.  Reviewed briefly.  Again offered pain medication, now accepts tylenol. -Bleeding precautions reviewed. -Encouraged to call or return to MAU if symptoms worsen or with the onset of new symptoms. -Discharged to home in stable condition.  Cherre Robins MSN, CNM Advanced Practice Provider, [facility] [employer]

## 2021-04-23 DIAGNOSIS — O3680X0 Pregnancy with inconclusive fetal viability, not applicable or unspecified: Secondary | ICD-10-CM

## 2021-04-23 LAB — GC/CHLAMYDIA PROBE AMP (~~LOC~~) NOT AT ARMC
Chlamydia: NEGATIVE
Comment: NEGATIVE
Comment: NORMAL
Neisseria Gonorrhea: NEGATIVE

## 2021-04-23 LAB — HCG, QUANTITATIVE, PREGNANCY: hCG, Beta Chain, Quant, S: 420 m[IU]/mL — ABNORMAL HIGH (ref <5)

## 2021-04-23 MED ORDER — ACETAMINOPHEN 500 MG PO TABS
1000.0000 mg | ORAL_TABLET | Freq: Once | ORAL | Status: AC
  Administered 2021-04-23: 1000 mg via ORAL
  Filled 2021-04-23: qty 2

## 2021-04-23 NOTE — Discharge Instructions (Signed)
Safe Medications in Pregnancy   Acne: Benzoyl Peroxide Salicylic Acid  Backache/Headache: Tylenol: 2 regular strength every 4 hours OR              2 Extra strength every 6 hours  Colds/Coughs/Allergies: Benadryl (alcohol free) 25 mg every 6 hours as needed Breath right strips Claritin Cepacol throat lozenges Chloraseptic throat spray Cold-Eeze- up to three times per day Cough drops, alcohol free Flonase (by prescription only) Guaifenesin Mucinex Robitussin DM (plain only, alcohol free) Saline nasal spray/drops Sudafed (pseudoephedrine) & Actifed ** use only after [redacted] weeks gestation and if you do not have high blood pressure Tylenol Vicks Vaporub Zinc lozenges Zyrtec   Constipation: Colace Ducolax suppositories Fleet enema Glycerin suppositories Metamucil Milk of magnesia Miralax Senokot Smooth move tea  Diarrhea: Kaopectate Imodium A-D  *NO pepto Bismol  Hemorrhoids: Anusol Anusol HC Preparation H Tucks  Indigestion: Tums Maalox Mylanta Zantac  Pepcid  Insomnia: Benadryl (alcohol free) 25mg  every 6 hours as needed Tylenol PM Unisom, no Gelcaps  Leg Cramps: Tums MagGel  Nausea/Vomiting:  Bonine Dramamine Emetrol Ginger extract Sea bands Meclizine  Nausea medication to take during pregnancy:  Unisom (doxylamine succinate 25 mg tablets) Take one tablet daily at bedtime. If symptoms are not adequately controlled, the dose can be increased to a maximum recommended dose of two tablets daily (1/2 tablet in the morning, 1/2 tablet mid-afternoon and one at bedtime). Vitamin B6 100mg  tablets. Take one tablet twice a day (up to 200 mg per day).  Skin Rashes: Aveeno products Benadryl cream or 25mg  every 6 hours as needed Calamine Lotion 1% cortisone cream  Yeast infection: Gyne-lotrimin 7 Monistat 7  Gum/tooth pain: Anbesol  **If taking multiple medications, please check labels to avoid duplicating the same active ingredients **take  medication as directed on the label ** Do not exceed 4000 mg of tylenol in 24 hours **Do not take medications that contain aspirin or ibuprofen     Abdominal Pain During Pregnancy Abdominal pain is common during pregnancy and has many possible causes. Some causes are more serious than others, and sometimes the cause is not known. Abdominal pain can be a sign that labor is starting. It can also be caused by normal growth of your baby causing stretching of muscles and ligaments during pregnancy. Always tell your health care provider if you have any abdominal pain. Follow these instructions at home:  Do not have sex or put anything in your vagina until your pain goes away completely.  Get plenty of rest until your pain improves.  Drink enough fluid to keep your urine pale yellow.  Take over-the-counter and prescription medicines only as told by your health care provider.  Keep all follow-up visits. This is important.   Contact a health care provider if:  Your pain continues or gets worse after resting.  You have lower abdominal pain that: ? Comes and goes at regular intervals. ? Spreads to your back. ? Is similar to menstrual cramps.  You have pain or burning when you urinate. Get help right away if:  You have a fever, chills, or shortness of breath.  You have vaginal bleeding.  You are leaking fluid or passing tissue from your vagina.  You have vomiting or diarrhea that lasts for more than 24 hours.  Your baby is moving less than usual.  You feel very weak or faint.  You develop severe pain in your upper abdomen. Summary  Abdominal pain is common during pregnancy and has many possible causes.  If you experience abdominal pain during pregnancy, tell your health care provider right away.  Follow your health care provider's home care instructions and keep all follow-up visits as told. This information is not intended to replace advice given to you by your health care  provider. Make sure you discuss any questions you have with your health care provider. Document Revised: 08/22/2020 Document Reviewed: 08/22/2020 Elsevier Patient Education  2021 [employer].

## 2021-04-25 ENCOUNTER — Ambulatory Visit: Payer: Medicaid Other

## 2021-04-25 ENCOUNTER — Other Ambulatory Visit: Payer: Self-pay

## 2021-04-25 ENCOUNTER — Other Ambulatory Visit: Payer: Self-pay | Admitting: Obstetrics and Gynecology

## 2021-04-25 DIAGNOSIS — O3680X0 Pregnancy with inconclusive fetal viability, not applicable or unspecified: Secondary | ICD-10-CM

## 2021-04-25 LAB — BETA HCG QUANT (REF LAB): hCG Quant: 1304 m[IU]/mL

## 2021-04-25 NOTE — Progress Notes (Signed)
Patient is here for STAT HCG quant after follow up visit from MAU on 04/22/2021. Patient does not complain of any abnormal pain at this time.  LMP: 03/23/2021  04/22/2021- HCG- 420   Patient given ectopic precaution and advised she will receive a phone call this afternoon regarding test results and recommendations.

## 2021-05-15 ENCOUNTER — Inpatient Hospital Stay: Admission: RE | Admit: 2021-05-15 | Payer: Medicaid Other | Source: Ambulatory Visit

## 2021-06-01 ENCOUNTER — Emergency Department (HOSPITAL_BASED_OUTPATIENT_CLINIC_OR_DEPARTMENT_OTHER): Payer: Medicaid Other

## 2021-06-01 ENCOUNTER — Other Ambulatory Visit: Payer: Self-pay

## 2021-06-01 ENCOUNTER — Emergency Department (HOSPITAL_BASED_OUTPATIENT_CLINIC_OR_DEPARTMENT_OTHER)
Admission: EM | Admit: 2021-06-01 | Discharge: 2021-06-01 | Disposition: A | Discharge status: Home / Self Care | Payer: Medicaid Other | Attending: Emergency Medicine | Admitting: Emergency Medicine

## 2021-06-01 DIAGNOSIS — R1030 Lower abdominal pain, unspecified: Secondary | ICD-10-CM | POA: Diagnosis not present

## 2021-06-01 DIAGNOSIS — O26899 Other specified pregnancy related conditions, unspecified trimester: Secondary | ICD-10-CM

## 2021-06-01 DIAGNOSIS — R519 Headache, unspecified: Secondary | ICD-10-CM | POA: Insufficient documentation

## 2021-06-01 DIAGNOSIS — Z91040 Latex allergy status: Secondary | ICD-10-CM | POA: Insufficient documentation

## 2021-06-01 DIAGNOSIS — J45909 Unspecified asthma, uncomplicated: Secondary | ICD-10-CM | POA: Diagnosis not present

## 2021-06-01 DIAGNOSIS — O219 Vomiting of pregnancy, unspecified: Secondary | ICD-10-CM | POA: Insufficient documentation

## 2021-06-01 DIAGNOSIS — R109 Unspecified abdominal pain: Secondary | ICD-10-CM

## 2021-06-01 DIAGNOSIS — Z3A10 10 weeks gestation of pregnancy: Secondary | ICD-10-CM | POA: Diagnosis not present

## 2021-06-01 DIAGNOSIS — O23591 Infection of other part of genital tract in pregnancy, first trimester: Secondary | ICD-10-CM | POA: Insufficient documentation

## 2021-06-01 DIAGNOSIS — N760 Acute vaginitis: Secondary | ICD-10-CM

## 2021-06-01 DIAGNOSIS — B373 Candidiasis of vulva and vagina: Secondary | ICD-10-CM | POA: Diagnosis not present

## 2021-06-01 DIAGNOSIS — B3731 Acute candidiasis of vulva and vagina: Secondary | ICD-10-CM

## 2021-06-01 DIAGNOSIS — B9689 Other specified bacterial agents as the cause of diseases classified elsewhere: Secondary | ICD-10-CM

## 2021-06-01 LAB — COMPREHENSIVE METABOLIC PANEL
ALT: 8 U/L (ref 0–44)
AST: 14 U/L — ABNORMAL LOW (ref 15–41)
Albumin: 4 g/dL (ref 3.5–5.0)
Alkaline Phosphatase: 83 U/L (ref 38–126)
Anion gap: 8 (ref 5–15)
BUN: 9 mg/dL (ref 6–20)
CO2: 25 mmol/L (ref 22–32)
Calcium: 9 mg/dL (ref 8.9–10.3)
Chloride: 103 mmol/L (ref 98–111)
Creatinine, Ser: 0.61 mg/dL (ref 0.44–1.00)
GFR, Estimated: >60 mL/min (ref >60)
Glucose, Bld: 93 mg/dL (ref 70–99)
Potassium: 4 mmol/L (ref 3.5–5.1)
Sodium: 136 mmol/L (ref 135–145)
Total Bilirubin: 0.2 mg/dL — ABNORMAL LOW (ref 0.3–1.2)
Total Protein: 7.1 g/dL (ref 6.5–8.1)

## 2021-06-01 LAB — WET PREP, GENITAL
Sperm: NONE SEEN
Trich, Wet Prep: NONE SEEN

## 2021-06-01 LAB — URINALYSIS, ROUTINE W REFLEX MICROSCOPIC
Bilirubin Urine: NEGATIVE
Glucose, UA: NEGATIVE mg/dL
Hgb urine dipstick: NEGATIVE
Ketones, ur: NEGATIVE mg/dL
Nitrite: NEGATIVE
Protein, ur: NEGATIVE mg/dL
Specific Gravity, Urine: 1.022 (ref 1.005–1.030)
pH: 6 (ref 5.0–8.0)

## 2021-06-01 LAB — CBC WITH DIFFERENTIAL/PLATELET
Abs Immature Granulocytes: 0.02 10*3/uL (ref 0.00–0.07)
Basophils Absolute: 0.1 10*3/uL (ref 0.0–0.1)
Basophils Relative: 1 %
Eosinophils Absolute: 0.1 10*3/uL (ref 0.0–0.5)
Eosinophils Relative: 1 %
HCT: 37.4 % (ref 36.0–46.0)
Hemoglobin: 12.1 g/dL (ref 12.0–15.0)
Immature Granulocytes: 0 %
Lymphocytes Relative: 38 %
Lymphs Abs: 3.6 10*3/uL (ref 0.7–4.0)
MCH: 27.1 pg (ref 26.0–34.0)
MCHC: 32.4 g/dL (ref 30.0–36.0)
MCV: 83.7 fL (ref 80.0–100.0)
Monocytes Absolute: 0.8 10*3/uL (ref 0.1–1.0)
Monocytes Relative: 8 %
Neutro Abs: 5 10*3/uL (ref 1.7–7.7)
Neutrophils Relative %: 52 %
Platelets: 316 10*3/uL (ref 150–400)
RBC: 4.47 MIL/uL (ref 3.87–5.11)
RDW: 13 % (ref 11.5–15.5)
WBC: 9.6 10*3/uL (ref 4.0–10.5)
nRBC: 0 % (ref 0.0–0.2)

## 2021-06-01 MED ORDER — ONDANSETRON 4 MG PO TBDP
4.0000 mg | ORAL_TABLET | Freq: Once | ORAL | Status: AC
  Administered 2021-06-01: 4 mg via ORAL

## 2021-06-01 MED ORDER — ONDANSETRON 4 MG PO TBDP
ORAL_TABLET | ORAL | Status: AC
  Administered 2021-06-01: 4 mg via ORAL
  Filled 2021-06-01: qty 1

## 2021-06-01 MED ORDER — ONDANSETRON 4 MG PO TBDP: 4.0000 mg | ORAL_TABLET | Freq: Three times a day (TID) | ORAL | 0 refills | Status: AC | PRN

## 2021-06-01 MED ORDER — CLOTRIMAZOLE 1 % VA CREA: 1.0000 | TOPICAL_CREAM | Freq: Every day | VAGINAL | 0 refills | Status: DC

## 2021-06-01 MED ORDER — METRONIDAZOLE 500 MG PO TABS: 500.0000 mg | ORAL_TABLET | Freq: Two times a day (BID) | ORAL | 0 refills | Status: DC

## 2021-06-01 NOTE — ED Provider Notes (Signed)
[facility] EMERGENCY DEPT Provider Note   CSN: [id number] Arrival date & time: 06/01/21  1842     History Chief Complaint  Patient presents with   Abdominal Pain   Headache    Kathleen [name] is a 25 y.o. female.  Kathleen [name] is currently pregnant.  She is G4, P1, and she has had 2 first trimester miscarriages.  She is followed by [facility] high risk obstetrics.  She states that she is experiencing pregnancy related nausea and vomiting.  She is also experiencing some sharp suprapubic pain and some intermittent headache.  The history is provided by the patient.  Abdominal Pain Pain location:  Suprapubic Pain quality: sharp   Pain radiates to:  Does not radiate Pain severity:  Mild Onset quality:  Gradual Duration:  2 days Timing:  Intermittent Progression:  Worsening Chronicity:  New Context comment:  [redacted] weeks pregnant Relieved by:  Nothing Worsened by:  Nothing Ineffective treatments:  None tried Associated symptoms: nausea, vaginal discharge and vomiting   Associated symptoms: no chest pain, no chills, no cough, no dysuria, no fever, no hematuria, no shortness of breath and no sore throat       Past Medical History:  Diagnosis Date   Anemia    Asthma    Chronic urticaria 07/05/2019   Factor VII deficiency (HCC)     Patient Active Problem List   Diagnosis Date Noted   Chronic urticaria 07/05/2019   Other allergic rhinitis 07/05/2019   Adverse food reaction 07/05/2019   Penicillin allergy 07/05/2019   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    Past Surgical History:  Procedure Laterality Date   ABCESS DRAINAGE     HERNIA REPAIR     TONSILLECTOMY     TONSILLECTOMY AND ADENOIDECTOMY     TUMOR REMOVAL     non cancerous   TYMPANOSTOMY TUBE PLACEMENT       OB History     Gravida  4   Para  1   Term  1   Preterm      AB  3   Living  1      SAB  3   IAB      Ectopic      Multiple   1   Live Births  1           Family History  Problem Relation Age of Onset   Allergic rhinitis Sister    Allergic rhinitis Maternal Grandmother    Allergic rhinitis Maternal Grandfather     Social History   Tobacco Use   Smoking status: Never   Smokeless tobacco: Never  Vaping Use   Vaping Use: Never used  Substance Use Topics   Alcohol use: Not Currently   Drug use: Never    Home Medications Prior to Admission medications   Medication Sig Start Date End Date Taking? Authorizing Provider  albuterol (VENTOLIN HFA) 108 (90 Base) MCG/ACT inhaler Inhale into the lungs. Patient not taking: Reported on 04/25/2021 04/23/19   [provider]  cetirizine (ZYRTEC) 10 MG tablet Take 1 tablet (10 mg total) by mouth daily. 07/05/19 08/04/19  Ellamae Sia, DO  escitalopram (LEXAPRO) 10 MG tablet Take 10 mg by mouth daily. 05/25/19   [provider]  fluticasone Aleda Grana) 50 MCG/ACT nasal spray 1 spray per nostril 1-2 times a day as needed Patient not taking: Reported on 04/25/2021 07/15/19   Ellamae Sia, DO  nystatin (MYCOSTATIN/NYSTOP) powder APPLY TO SKIN  IN AFFECTED AREA TWICE DAILY. Patient not taking: Reported on 04/25/2021 03/23/19   [provider]    Allergies    Citrus, Nsaids, Penicillins, Latex, and Tea tree oil  Review of Systems   Review of Systems  Constitutional:  Negative for chills and fever.  HENT:  Negative for ear pain and sore throat.   Eyes:  Negative for pain and visual disturbance.  Respiratory:  Negative for cough and shortness of breath.   Cardiovascular:  Negative for chest pain and palpitations.  Gastrointestinal:  Positive for abdominal pain, nausea and vomiting.  Genitourinary:  Positive for vaginal discharge. Negative for dysuria and hematuria.  Musculoskeletal:  Negative for arthralgias and back pain.  Skin:  Negative for color change and rash.  Neurological:  Positive for headaches. Negative for seizures and syncope.  All other  systems reviewed and are negative.  Physical Exam Updated Vital Signs BP (!) 104/92 (BP Location: Left Arm)   Pulse 92   Temp 98.4 F (36.9 C) (Oral)   Resp 18   Ht 5\' 6"  (1.676 m)   Wt 105.2 kg   LMP 03/23/2021 (Approximate)   SpO2 98%   BMI 37.45 kg/m   Physical Exam Vitals and nursing note reviewed.  HENT:     Head: Normocephalic and atraumatic.  Eyes:     General: No scleral icterus. Pulmonary:     Effort: Pulmonary effort is normal. No respiratory distress.  Abdominal:     General: There is no distension.     Palpations: Abdomen is soft.     Tenderness: There is no abdominal tenderness.  Genitourinary:    Vagina: Normal.     Cervix: Normal.     Comments: Os closed. Thick, white discharge Musculoskeletal:     Cervical back: Normal range of motion.  Skin:    General: Skin is warm and dry.  Neurological:     Mental Status: She is alert.  Psychiatric:        Mood and Affect: Mood normal.    ED Results / Procedures / Treatments   Labs (all labs ordered are listed, but only abnormal results are displayed) Labs Reviewed  WET PREP, GENITAL - Abnormal; Notable for the following components:      Result Value   Yeast Wet Prep HPF POC PRESENT (*)    Clue Cells Wet Prep HPF POC PRESENT (*)    WBC, Wet Prep HPF POC FEW (*)    All other components within normal limits  COMPREHENSIVE METABOLIC PANEL - Abnormal; Notable for the following components:   AST 14 (*)    Total Bilirubin 0.2 (*)    All other components within normal limits  URINALYSIS, ROUTINE W REFLEX MICROSCOPIC - Abnormal; Notable for the following components:   Leukocytes,Ua SMALL (*)    Bacteria, UA RARE (*)    All other components within normal limits  CBC WITH DIFFERENTIAL/PLATELET    EKG None  Radiology 05/23/2021 OB LESS THAN 14 WEEKS WITH OB TRANSVAGINAL  Result Date: 06/01/2021 CLINICAL DATA:  Pelvic pain for 2-3 days EXAM: OBSTETRIC <14 WK 08/01/2021 AND TRANSVAGINAL OB US TECHNIQUE: Both transabdominal  and transvaginal ultrasound examinations were performed for complete evaluation of the gestation as well as the maternal uterus, adnexal regions, and pelvic cul-de-sac. Transvaginal technique was performed to assess early pregnancy. COMPARISON:  04/23/2021 FINDINGS: Intrauterine gestational sac: Present Yolk sac:  Present Embryo:  Present Cardiac Activity: Present Heart Rate: 164 bpm CRL:  33.1 mm   10 w  1 d                  [place] EDC: 12/27/2021 Subchorionic hemorrhage:  None visualized. Maternal uterus/adnexae: Ovaries are not well visualized due to overlying bowel gas. IMPRESSION: Single live intrauterine gestation at 10 weeks 1 day. No acute abnormality noted. Electronically Signed   By: Alcide Clever M.D.   On: 06/01/2021 21:47    Procedures Procedures   Medications Ordered in ED Medications - No data to display  ED Course  I have reviewed the triage vital signs and the nursing notes.  Pertinent labs & imaging results that were available during my care of the patient were reviewed by me and considered in my medical decision making (see chart for details).    MDM Rules/Calculators/A&P                          Enid Baas BUFORD GAYLER is [redacted] weeks pregnant.  She presents with nausea, vomiting, and vaginal discharge.  She also endorsed sharp, central abdominal pain.  She was hemodynamically stable.  IV attempts were not successful, and she actually did quite well with oral Zofran.  ED work-up revealed that her pregnancy is uncomplicated.  She does have evidence of both BV and yeast.  She will be treated for both.  She does have follow-up with her OB/GYN.  Return precautions were discussed. Final Clinical Impression(s) / ED Diagnoses Final diagnoses:  [redacted] weeks gestation of pregnancy  Pregnancy related nausea, antepartum  Bacterial vaginitis  Candidal vaginitis    Rx / DC Orders ED Discharge Orders          Ordered    ondansetron (ZOFRAN-ODT) 4 MG disintegrating tablet  Every 8 hours PRN         06/01/21 2230    metroNIDAZOLE (FLAGYL) 500 MG tablet  2 times daily        06/01/21 2252    clotrimazole (GYNE-LOTRIMIN) 1 % vaginal cream  Daily at bedtime        06/01/21 2254             Koleen Distance, MD 06/01/21 2300

## 2021-06-01 NOTE — ED Notes (Addendum)
RT obtained PIV in Right AC on first attempt. Blood specimens collected, flushed, but while securing device catheter came out. RT notified RN.

## 2021-06-01 NOTE — ED Notes (Signed)
Patient transported to Ultrasound 

## 2021-06-01 NOTE — ED Triage Notes (Signed)
Pt to ED from home with c/o abd pain/headaches. Pt reports she is [redacted] weeks pregnant and has been having clear/white discharge. Pt states she has been pregnant before and had these same symptoms and she just feels nauseous and sick to her stomach and wants to feel better.

## 2021-06-01 NOTE — ED Notes (Signed)
Assisted MD with pelvic exam

## 2021-06-01 NOTE — ED Notes (Signed)
Unable to obtain IV. Pt refusing second attempt by this RN. C/o nausea.

## 2021-07-02 ENCOUNTER — Encounter (HOSPITAL_BASED_OUTPATIENT_CLINIC_OR_DEPARTMENT_OTHER): Payer: Self-pay | Admitting: Emergency Medicine

## 2021-07-02 ENCOUNTER — Other Ambulatory Visit: Payer: Self-pay

## 2021-07-02 ENCOUNTER — Emergency Department (HOSPITAL_BASED_OUTPATIENT_CLINIC_OR_DEPARTMENT_OTHER)
Admission: EM | Admit: 2021-07-02 | Discharge: 2021-07-02 | Disposition: A | Discharge status: Home / Self Care | Payer: Medicaid Other | Attending: Emergency Medicine | Admitting: Emergency Medicine

## 2021-07-02 DIAGNOSIS — Z3A15 15 weeks gestation of pregnancy: Secondary | ICD-10-CM | POA: Diagnosis not present

## 2021-07-02 DIAGNOSIS — O26892 Other specified pregnancy related conditions, second trimester: Secondary | ICD-10-CM | POA: Diagnosis present

## 2021-07-02 DIAGNOSIS — Z5321 Procedure and treatment not carried out due to patient leaving prior to being seen by health care provider: Secondary | ICD-10-CM | POA: Diagnosis not present

## 2021-07-02 NOTE — ED Notes (Signed)
Pt called for room x4 attempts. No answer.

## 2021-07-02 NOTE — ED Triage Notes (Signed)
Pt arrives to ED with c/o of lower abdominal/pelvic cramping x1 day. Pt reports she is [redacted] weeks pregnant. Pt denies vaginal bleeding. No fevers or chills.

## 2021-10-12 ENCOUNTER — Encounter (HOSPITAL_COMMUNITY): Payer: Self-pay | Admitting: Obstetrics and Gynecology

## 2021-10-12 ENCOUNTER — Other Ambulatory Visit: Payer: Self-pay

## 2021-10-12 ENCOUNTER — Inpatient Hospital Stay (HOSPITAL_COMMUNITY)
Admission: AD | Admit: 2021-10-12 | Discharge: 2021-10-12 | Disposition: A | Discharge status: Home / Self Care | Payer: Medicaid Other | Attending: Obstetrics and Gynecology | Admitting: Obstetrics and Gynecology

## 2021-10-12 DIAGNOSIS — Z3A29 29 weeks gestation of pregnancy: Secondary | ICD-10-CM | POA: Diagnosis not present

## 2021-10-12 DIAGNOSIS — O368130 Decreased fetal movements, third trimester, not applicable or unspecified: Secondary | ICD-10-CM | POA: Diagnosis present

## 2021-10-12 DIAGNOSIS — Z3689 Encounter for other specified antenatal screening: Secondary | ICD-10-CM | POA: Diagnosis not present

## 2021-10-12 NOTE — MAU Provider Note (Signed)
History   [id number]   Chief Complaint  Patient presents with   Decreased Fetal Movement    HPI Kathleen [name] is a 25 y.o. female  [id number] here with report of decreased fetal movement since Wednesday.  Reports feeling the baby move approximately 0 times in the past 24 hour.  Denies vaginal bleeding or leaking of fluid.  Denies abdominal pain.   Patient's last menstrual period was 03/23/2021 (approximate).  OB History  Gravida Para Term Preterm AB Living  5 1 1   3  0  SAB IAB Ectopic Multiple Live Births  3     0 0    # Outcome Date GA Lbr Len/2nd Weight Sex Delivery Anes PTL Lv  5 Current           4 Term 2020     Vag-Spont     3 SAB 2017          2 SAB 2017          1 SAB 2016            Past Medical History:  Diagnosis Date   Anemia    Asthma    Chronic urticaria 07/05/2019   Factor VII deficiency (HCC)     Family History  Problem Relation Age of Onset   Allergic rhinitis Sister    Allergic rhinitis Maternal Grandmother    Allergic rhinitis Maternal Grandfather     Social History   Socioeconomic History   Marital status: Single    Spouse name: Not on file   Number of children: Not on file   Years of education: Not on file   Highest education level: Not on file  Occupational History   Not on file  Tobacco Use   Smoking status: Never   Smokeless tobacco: Never  Vaping Use   Vaping Use: Never used  Substance and Sexual Activity   Alcohol use: Not Currently   Drug use: Never   Sexual activity: Not Currently    Birth control/protection: None  Other Topics Concern   Not on file  Social History Narrative   Not on file   Social Determinants of Health   Financial Resource Strain: Not on file  Food Insecurity: Not on file  Transportation Needs: Not on file  Physical Activity: Not on file  Stress: Not on file  Social Connections: Not on file    Allergies  Allergen Reactions   Citrus Hives and Itching   Nsaids Other (See Comments)    Cannot  take due to blood disorder   Penicillins Hives and Swelling   Latex    Tea Tree Oil     No current facility-administered medications on file prior to encounter.   Current Outpatient Medications on File Prior to Encounter  Medication Sig Dispense Refill   Doxylamine-Pyridoxine (DICLEGIS PO) Take by mouth.     famotidine (PEPCID) 20 MG tablet Take 20 mg by mouth 2 (two) times daily.     albuterol (VENTOLIN HFA) 108 (90 Base) MCG/ACT inhaler Inhale into the lungs. (Patient not taking: Reported on 04/25/2021)     cetirizine (ZYRTEC) 10 MG tablet Take 1 tablet (10 mg total) by mouth daily. 90 tablet 0   clotrimazole (GYNE-LOTRIMIN) 1 % vaginal cream Place 1 Applicatorful vaginally at bedtime. 45 g 0   escitalopram (LEXAPRO) 10 MG tablet Take 10 mg by mouth daily.     fluticasone (FLONASE) 50 MCG/ACT nasal spray 1 spray per nostril 1-2 times a day as needed (Patient  not taking: Reported on 04/25/2021) 16 g 5   metroNIDAZOLE (FLAGYL) 500 MG tablet Take 1 tablet (500 mg total) by mouth 2 (two) times daily. 14 tablet 0   nystatin (MYCOSTATIN/NYSTOP) powder APPLY TO SKIN IN AFFECTED AREA TWICE DAILY. (Patient not taking: Reported on 04/25/2021)     ondansetron (ZOFRAN-ODT) 4 MG disintegrating tablet Take 1 tablet (4 mg total) by mouth every 8 (eight) hours as needed for nausea or vomiting. 20 tablet 0     Review of Systems  Constitutional: Negative.   Gastrointestinal: Negative.   Genitourinary: Negative.     Physical Exam   Vitals:   10/12/21 1242 10/12/21 1253  BP: 121/72 125/72  Pulse: 90 91  Resp: 18   Temp: 98 F (36.7 C)   Weight: 105.7 kg   Height: 5\' 7"  (1.702 m)     Physical Exam Vitals and nursing note reviewed.  Constitutional:      Appearance: Normal appearance. She is not ill-appearing.  HENT:     Head: Normocephalic and atraumatic.  Eyes:     General: No scleral icterus. Pulmonary:     Effort: Pulmonary effort is normal. No respiratory distress.  Neurological:      General: No focal deficit present.     Mental Status: She is alert.  Psychiatric:        Mood and Affect: Mood normal.        Behavior: Behavior normal.   NST:  Baseline: 145 bpm, Variability: Good {> 6 bpm), Accelerations: Reactive, and Decelerations: Absent  MAU Course  Procedures  MDM Patient reports no movement since Wednesday. Since arrival reports return of movement. Felt 7+ movements in 1 hour & had a reactive fetal tracing.   Assessment and Plan   1. Decreased fetal movements in third trimester, single or unspecified fetus   2. NST (non-stress test) reactive   3. [redacted] weeks gestation of pregnancy    -reviewed fetal movements & reasons to return to MAU -keep f/u with OB   Saturday, NP 10/12/2021 1:25 PM

## 2021-10-12 NOTE — MAU Note (Signed)
Pt reports she has not felt baby move since Wed. Denies any vag bleeding or leaking. Has some occasional cramping nd pelvic pressure but not very often.

## 2021-11-28 ENCOUNTER — Encounter (HOSPITAL_COMMUNITY): Payer: Self-pay | Admitting: Family Medicine

## 2021-11-28 ENCOUNTER — Inpatient Hospital Stay (HOSPITAL_COMMUNITY)
Admission: AD | Admit: 2021-11-28 | Discharge: 2021-11-28 | Disposition: A | Discharge status: Home / Self Care | Payer: Medicaid Other | Attending: Family Medicine | Admitting: Family Medicine

## 2021-11-28 ENCOUNTER — Inpatient Hospital Stay (HOSPITAL_COMMUNITY): Payer: Medicaid Other

## 2021-11-28 ENCOUNTER — Other Ambulatory Visit: Payer: Self-pay

## 2021-11-28 DIAGNOSIS — R1010 Upper abdominal pain, unspecified: Secondary | ICD-10-CM | POA: Insufficient documentation

## 2021-11-28 DIAGNOSIS — O99891 Other specified diseases and conditions complicating pregnancy: Secondary | ICD-10-CM

## 2021-11-28 DIAGNOSIS — R600 Localized edema: Secondary | ICD-10-CM | POA: Insufficient documentation

## 2021-11-28 DIAGNOSIS — O99113 Other diseases of the blood and blood-forming organs and certain disorders involving the immune mechanism complicating pregnancy, third trimester: Secondary | ICD-10-CM | POA: Diagnosis not present

## 2021-11-28 DIAGNOSIS — Z3A35 35 weeks gestation of pregnancy: Secondary | ICD-10-CM | POA: Insufficient documentation

## 2021-11-28 DIAGNOSIS — R102 Pelvic and perineal pain: Secondary | ICD-10-CM | POA: Insufficient documentation

## 2021-11-28 DIAGNOSIS — O26893 Other specified pregnancy related conditions, third trimester: Secondary | ICD-10-CM | POA: Insufficient documentation

## 2021-11-28 DIAGNOSIS — D682 Hereditary deficiency of other clotting factors: Secondary | ICD-10-CM | POA: Insufficient documentation

## 2021-11-28 DIAGNOSIS — O3680X0 Pregnancy with inconclusive fetal viability, not applicable or unspecified: Secondary | ICD-10-CM | POA: Diagnosis present

## 2021-11-28 DIAGNOSIS — R519 Headache, unspecified: Secondary | ICD-10-CM | POA: Diagnosis not present

## 2021-11-28 LAB — URINALYSIS, MICROSCOPIC (REFLEX)

## 2021-11-28 LAB — URINALYSIS, ROUTINE W REFLEX MICROSCOPIC
Bilirubin Urine: NEGATIVE
Glucose, UA: NEGATIVE mg/dL
Hgb urine dipstick: NEGATIVE
Ketones, ur: NEGATIVE mg/dL
Nitrite: NEGATIVE
Protein, ur: NEGATIVE mg/dL
Specific Gravity, Urine: 1.01 (ref 1.005–1.030)
pH: 8 (ref 5.0–8.0)

## 2021-11-28 LAB — AMNISURE RUPTURE OF MEMBRANE (ROM) NOT AT ARMC: Amnisure ROM: NEGATIVE

## 2021-11-28 MED ORDER — ACETAMINOPHEN 500 MG PO TABS
1000.0000 mg | ORAL_TABLET | Freq: Once | ORAL | Status: AC
  Administered 2021-11-28: 1000 mg via ORAL
  Filled 2021-11-28: qty 2

## 2021-11-28 NOTE — MAU Provider Note (Signed)
History     CSN: [id number]  Arrival date and time: 11/28/21 1238   Event Date/Time   First Provider Initiated Contact with Patient 11/28/21 1349      Chief Complaint  Patient presents with   Headache   Abdominal Pain   Decreased Fetal Movement   Facial Swelling   Foot Swelling   HPI  [name] is a 25 y.o. female with a hx of factor VII deficiency,  [id number] @ [redacted]w[redacted]d here in MAU with complaints of headache, pelvic pressure, decreased fetal movement and bilateral foot swelling.  She reports off and on headache all day. She took tylenol 1 gram earlier today and this helped only minimally. The HA is located all over her head. She reports and increase in swelling in both feet recently. She was seen in the office today and had an NST and normal BP readings.   OB History     Gravida  5   Para  1   Term  1   Preterm      AB  3   Living  1      SAB  3   IAB      Ectopic      Multiple  0   Live Births  1           Past Medical History:  Diagnosis Date   Anemia    Asthma    Chronic urticaria 07/05/2019   Factor VII deficiency (HCC)     Past Surgical History:  Procedure Laterality Date   ABCESS DRAINAGE     HERNIA REPAIR     TONSILLECTOMY AND ADENOIDECTOMY     TUMOR REMOVAL     non cancerous   TYMPANOSTOMY TUBE PLACEMENT      Family History  Problem Relation Age of Onset   Allergic rhinitis Sister    Allergic rhinitis Maternal Grandmother    Allergic rhinitis Maternal Grandfather     Social History   Tobacco Use   Smoking status: Never   Smokeless tobacco: Never  Vaping Use   Vaping Use: Never used  Substance Use Topics   Alcohol use: Not Currently   Drug use: Never    Allergies:  Allergies  Allergen Reactions   Citrus Hives and Itching   Nsaids Other (See Comments)    Cannot take due to blood disorder   Penicillins Hives and Swelling   Latex    Tea Tree Oil     Medications Prior to Admission  Medication Sig  Dispense Refill Last Dose   acetaminophen (TYLENOL) 500 MG tablet Take 500 mg by mouth every 6 (six) hours as needed.   11/28/2021 at 0400   Doxylamine-Pyridoxine (DICLEGIS PO) Take by mouth.      escitalopram (LEXAPRO) 10 MG tablet Take 10 mg by mouth daily.      famotidine (PEPCID) 20 MG tablet Take 20 mg by mouth 2 (two) times daily.      ondansetron (ZOFRAN-ODT) 4 MG disintegrating tablet Take 1 tablet (4 mg total) by mouth every 8 (eight) hours as needed for nausea or vomiting. 20 tablet 0 More than a month   Results for orders placed or performed during the hospital encounter of 11/28/21 (from the past 48 hour(s))  Urinalysis, Routine w reflex microscopic Urine, Clean Catch     Status: Abnormal   Collection Time: 11/28/21 12:46 PM  Result Value Ref Range   Color, Urine YELLOW YELLOW   APPearance CLEAR CLEAR   Specific Gravity,  Urine 1.010 1.005 - 1.030   pH 8.0 5.0 - 8.0   Glucose, UA NEGATIVE NEGATIVE mg/dL   Hgb urine dipstick NEGATIVE NEGATIVE   Bilirubin Urine NEGATIVE NEGATIVE   Ketones, ur NEGATIVE NEGATIVE mg/dL   Protein, ur NEGATIVE NEGATIVE mg/dL   Nitrite NEGATIVE NEGATIVE   Leukocytes,Ua TRACE (A) NEGATIVE    Comment: Performed at [facility], [address] [place], [place] [address]  Urinalysis, Microscopic (reflex)     Status: Abnormal   Collection Time: 11/28/21 12:46 PM  Result Value Ref Range   RBC / HPF 0-5 0 - 5 RBC/hpf   WBC, UA 6-10 0 - 5 WBC/hpf   Bacteria, UA RARE (A) NONE SEEN   Squamous Epithelial / LPF 11-20 0 - 5   Mucus PRESENT     Comment: Performed at [facility], [address] [place], [place] [address]  Amnisure rupture of membrane (rom)not at [facility]     Status: None   Collection Time: 11/28/21  3:30 PM  Result Value Ref Range   Amnisure ROM NEGATIVE     Comment: Performed at [facility], [address], [place], [place] [address]    US ABDOMEN LIMITED RUQ (LIVER/GB)  Result Date: 11/28/2021 CLINICAL DATA:  Right  upper quadrant abdominal pain. EXAM: ULTRASOUND ABDOMEN LIMITED RIGHT UPPER QUADRANT COMPARISON:  None. FINDINGS: Gallbladder: No gallstones or wall thickening visualized. No sonographic Murphy sign noted by sonographer. Common bile duct: Diameter: 3 mm which is within normal limits Liver: No focal lesion identified. Within normal limits in parenchymal echogenicity. Portal vein is patent on color Doppler imaging with normal direction of blood flow towards the liver. Other: None. IMPRESSION: No definite abnormality seen in the right upper quadrant of the abdomen. Electronically Signed   By: Lupita Raider M.D.   On: 11/28/2021 14:45     Review of Systems  Constitutional:  Negative for fatigue and fever.  Gastrointestinal:  Positive for abdominal pain.  Neurological:  Positive for headaches.  Physical Exam   Blood pressure 116/64, pulse 76, temperature 99 F (37.2 C), temperature source Oral, resp. rate 17, last menstrual period 03/23/2021, SpO2 99 %, unknown if currently breastfeeding.  Patient Vitals for the past 24 hrs:  BP Pulse Resp SpO2  11/28/21 1653 125/75 77 17 100 %  11/28/21 1635 128/79 81 -- --     Physical Exam Vitals and nursing note reviewed.  Constitutional:      General: She is not in acute distress.    Appearance: She is well-developed. She is not ill-appearing, toxic-appearing or diaphoretic.  HENT:     Head: Normocephalic.  Pulmonary:     Effort: Pulmonary effort is normal.  Genitourinary:    Comments: Dilation: 1 Effacement (%): Thick Station: -3 Exam by::  (Nikaya Nasby, Harolyn Rutherford, NP)  Musculoskeletal:        General: No swelling. Normal range of motion.  Skin:    General: Skin is warm.  Neurological:     Mental Status: She is alert and oriented to person, place, and time.     GCS: GCS eye subscore is 4. GCS verbal subscore is 5. GCS motor subscore is 6.     Deep Tendon Reflexes: Reflexes normal.     Comments: Dilation: 1 Effacement (%): Thick Station:  -3 Exam by::  (Adrean Heitz, Harolyn Rutherford, NP)   Psychiatric:        Behavior: Behavior normal.   Fetal Tracing: Baseline: 145 bpm Variability: Moderate  Accelerations: 15x15 Decelerations: None Toco:  UI  MAU Course  Procedures None  MDM  Patient declines any other medication for HA- she requests to try tylenol again Tylenol 1 gram give. Patient reports improvement.    Assessment and Plan   A:  1. Pelvic pressure in female   2. Upper abdominal pain   3. [redacted] weeks gestation of pregnancy   4. Pregnancy headache in third trimester      P:  Discharge home Labor precautions Return to MAU if symptoms worse BP are grossly normal  Amanee Iacovelli, Harolyn Rutherford, NP 11/29/2021 4:06 PM

## 2021-11-28 NOTE — MAU Note (Signed)
...  Kathleen [name] is a 25 y.o. at [redacted]w[redacted]d here in MAU reporting: DFM, Braxton hicks, HA, RUQ pain, floaters, facial swelling, and hand swellling. She states she has been expriencing DFM since yesterday with no movement felt today. She states she has had recurrent HA's for the past few weeks but this episode started yesterday. She states she took 1000 mg of Tylenol this morning at 0400 with no relief. She is also endorsing RUQ pain that has been occurring for weeks and states her OB has been made aware several times. She is endorsing visual floaters since last night and is having them here in MAU currently. She states yesterday she was experiencing facial swelling and hand swelling but it decreased today. She is also endorsing abdominal tightening and states they are braxton hicks and has been having them for the past month. Denies VB today but states she had vaginal spotting two days ago.  Has Factor VII deficiency.   Next OB appointment 11/30/2021.  Pain score:  6/10 HA 8/10 right upper quadrant pain 8/10 abdomen - braxton hicks  FHT: 183 initial external Lab orders placed from triage: UA

## 2021-11-28 NOTE — MAU Note (Signed)
Patient signed printed AVS. Placed in chart. 

## 2022-04-14 IMAGING — US US OB < 14 WEEKS - US OB TV
1 series · 15 of 28 positions shown · non-contrast
Comparison: Obstetrical ultrasound 03/09/2019

CLINICAL DATA: Mid abdominal pain for 2 days, no vaginal bleeding,
quantitative beta HCG = 420

EXAM:
OBSTETRIC <14 WK US AND TRANSVAGINAL OB US
TECHNIQUE: Both transabdominal and transvaginal ultrasound examinations were
performed for complete evaluation of the gestation as well as the
maternal uterus, adnexal regions, and pelvic cul-de-sac.
Transvaginal technique was performed to assess early pregnancy.

[Series 1: us ob < 14 weeks - us ob tv · 15 of 50 slices shown]
[im 1/50]
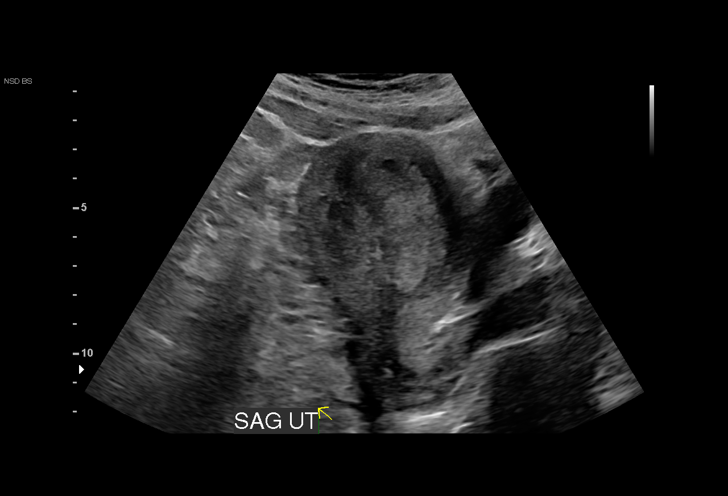
[im 4/50]
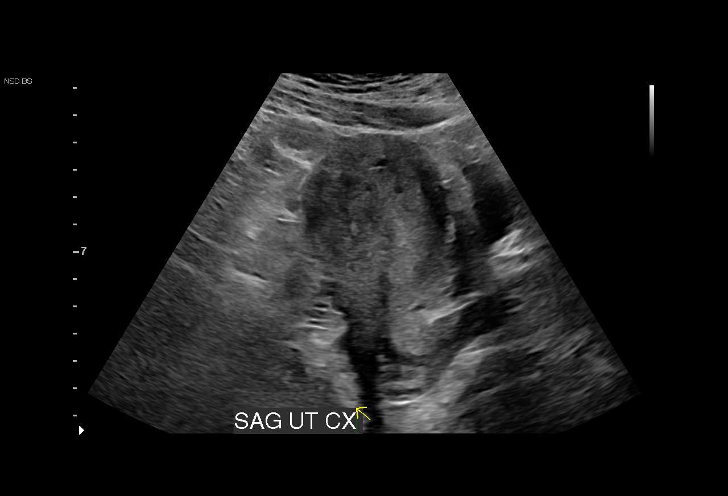
[im 8/50]
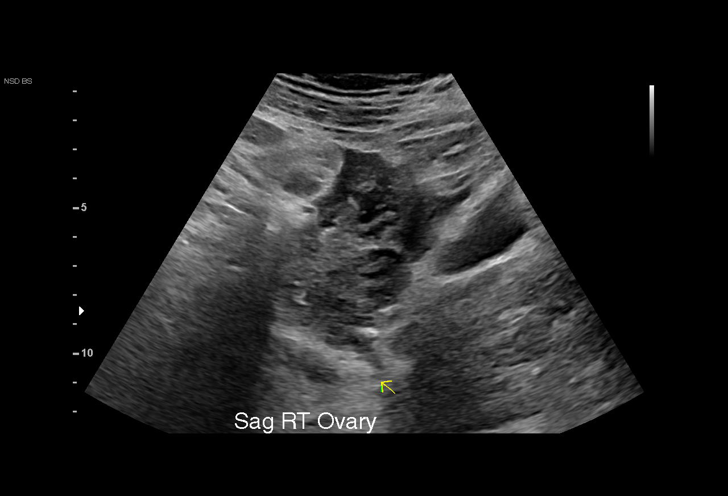
[im 11/50]
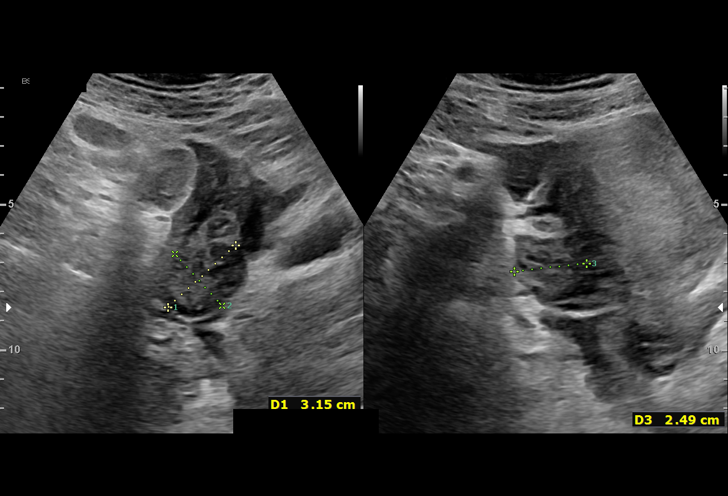
[im 15/50]
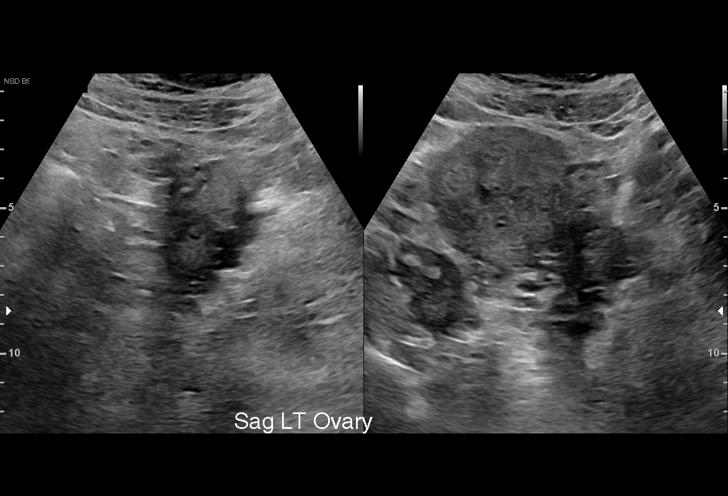
[im 19/50]
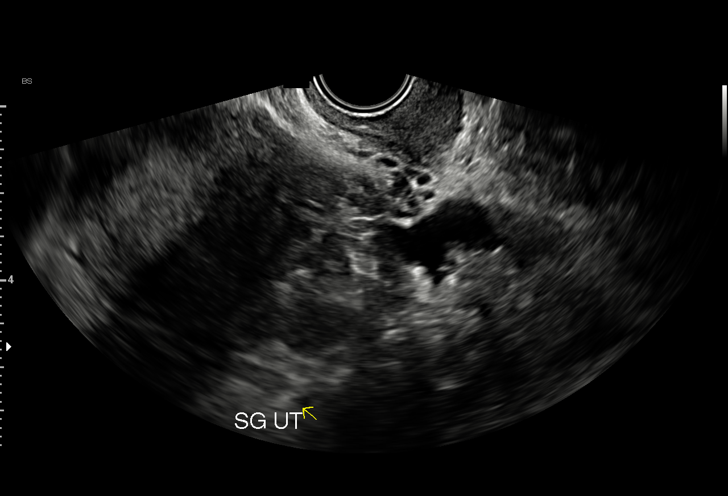
[im 22/50]
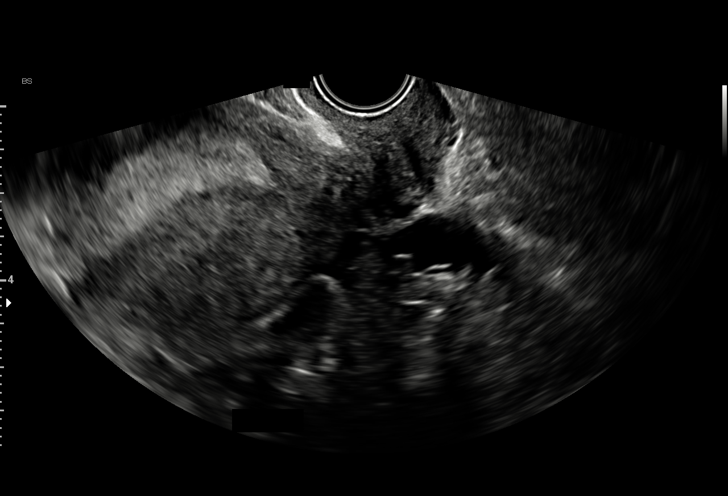
[im 26/50]
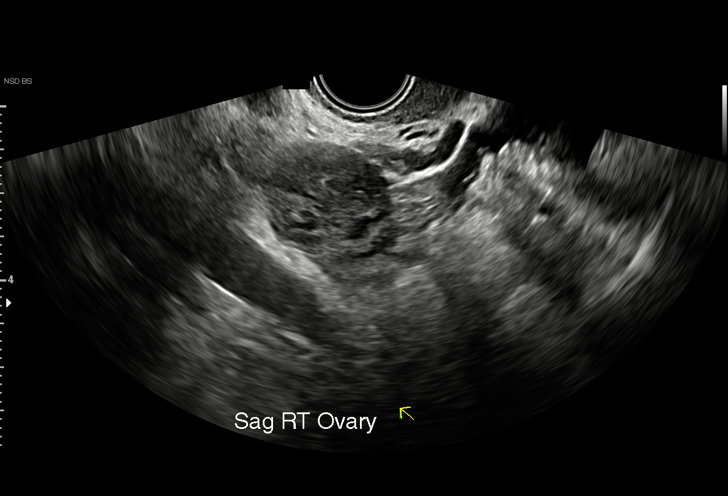
[im 28/50]
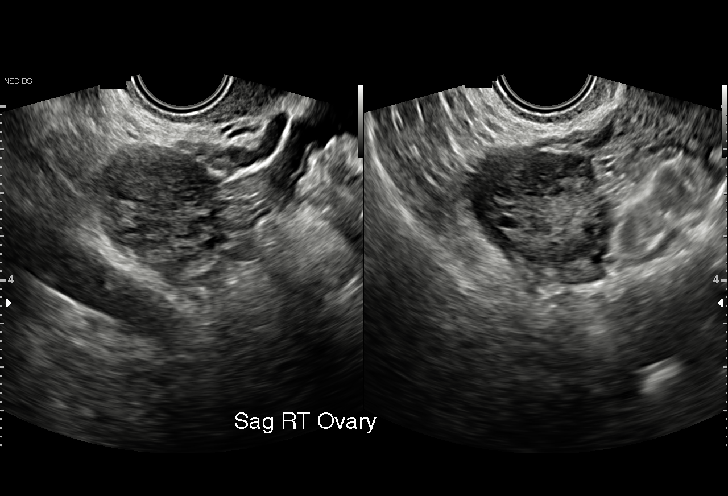
[im 31/50]
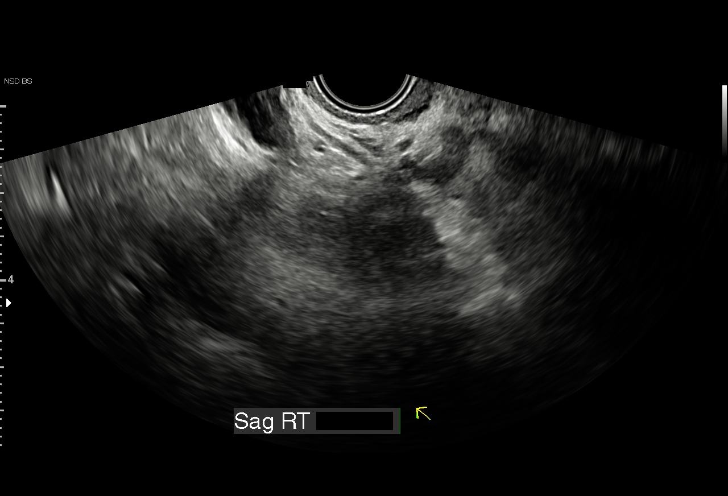
[im 35/50]
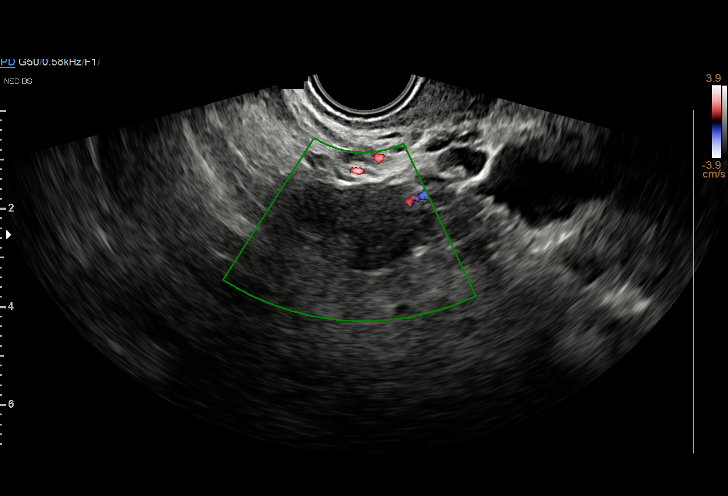
[im 39/50]
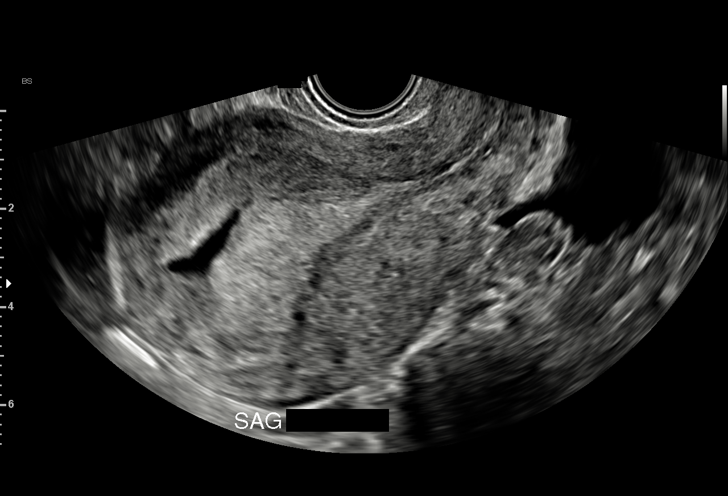
[im 42/50]
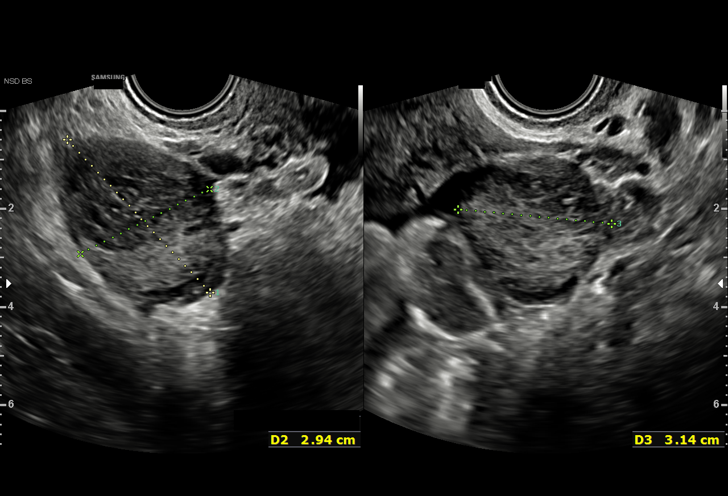
[im 46/50]
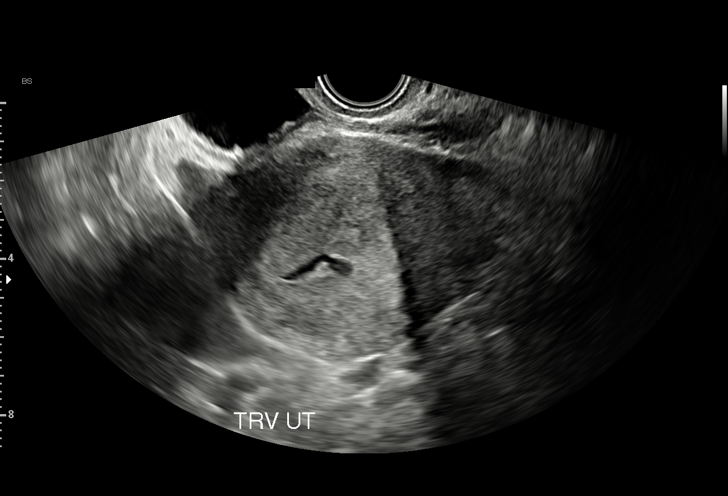
[im 50/50]
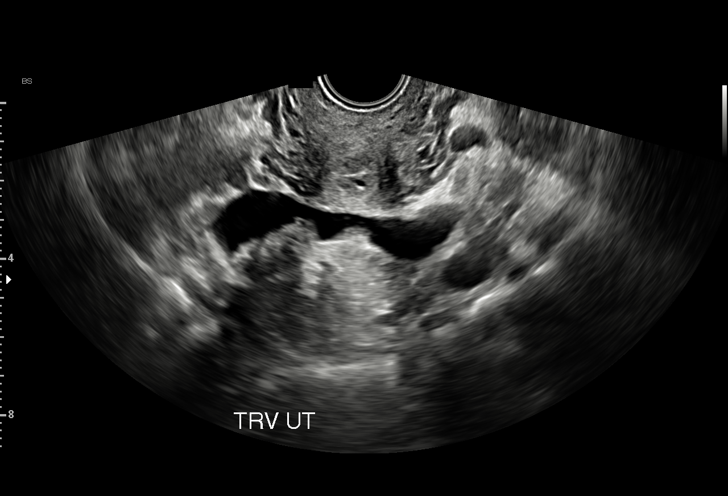

[15 of 28 positions shown; findings below may reference images not displayed]

FINDINGS: Intrauterine gestational sac: None

Yolk sac:  Not Visualized.

Embryo:  Not Visualized.

Cardiac Activity: Not Visualized.

Maternal uterus/adnexae: Anteverted maternal uterus. Small volume of
fluid is seen in the fundal endometrium. Small volume of non
organized fluid is seen within the fundal endometrium. Normal
appearance of the ovaries with a probable corpus luteum in the right
ovary. Moderate volume of free fluid in the pelvis is anechoic,
nonspecific. No focal mass or discretely identifiable ectopic is
seen.
IMPRESSION: Small amount of non organized fluid is seen within the endometrial
canal, unlikely to reflect a discrete gestational sac. No
intrauterine pregnancy visualized. Differential considerations would
include early intrauterine pregnancy too early to visualize,
spontaneous abortion, or occult ectopic pregnancy. Recommend close
clinical followup and serial quantitative beta HCGs and ultrasounds.

Moderate volume of anechoic free fluid in the deep pelvis is
nonspecific, though somewhat greater than expected for normal
physiologic fluid.

## 2022-05-24 IMAGING — US US OB < 14 WEEKS - US OB TV
1 series · 14 of 28 positions shown · non-contrast
Comparison: 04/23/2021

CLINICAL DATA: Pelvic pain for 2-3 days

EXAM:
OBSTETRIC <14 WK US AND TRANSVAGINAL OB US
TECHNIQUE: Both transabdominal and transvaginal ultrasound examinations were
performed for complete evaluation of the gestation as well as the
maternal uterus, adnexal regions, and pelvic cul-de-sac.
Transvaginal technique was performed to assess early pregnancy.

[Series 1: us ob less than 14 weeks with ob transvaginal · 14 of 90 slices shown]
[im 4/90]
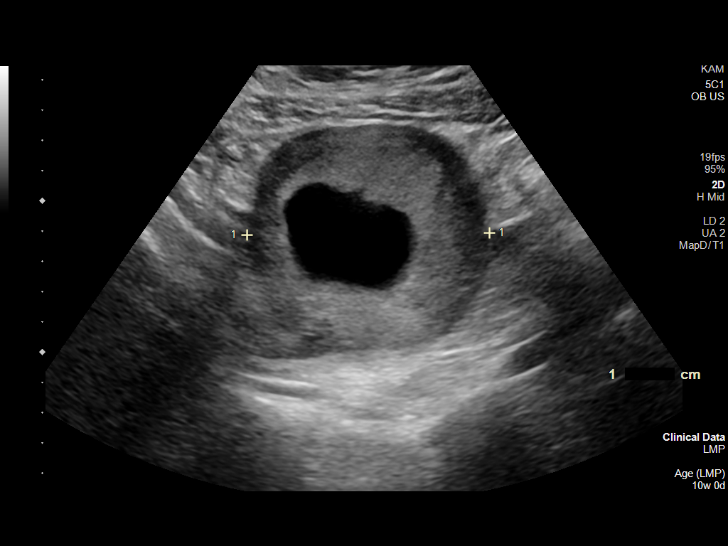
[im 10/90]
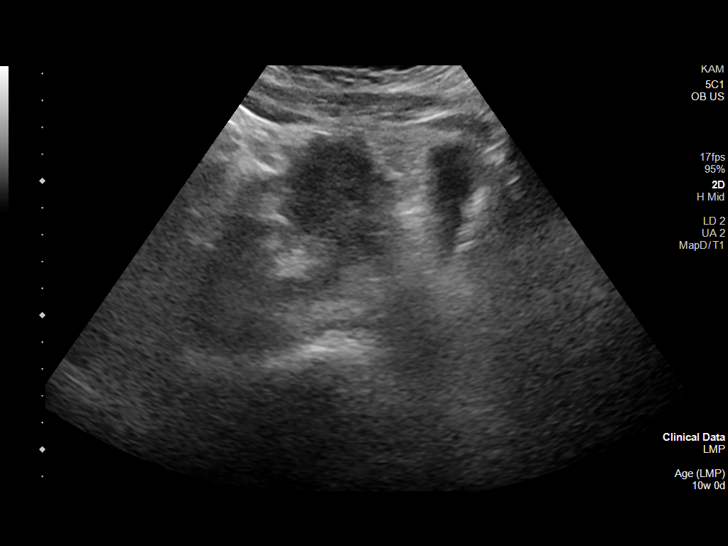
[im 17/90]
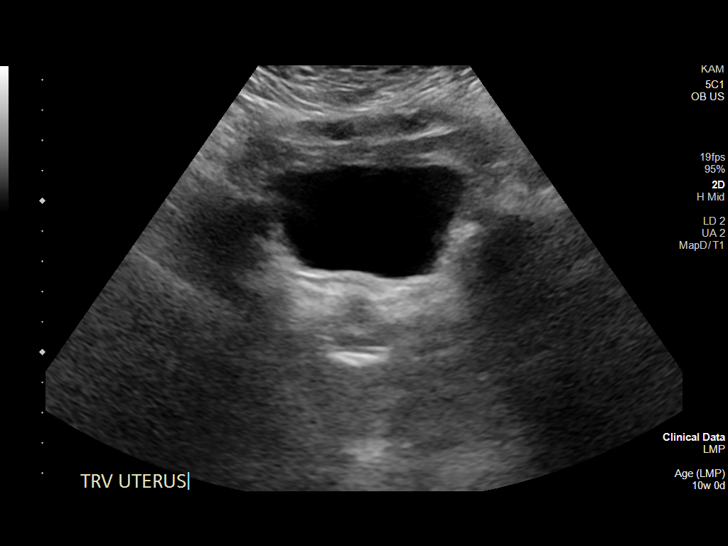
[im 24/90]
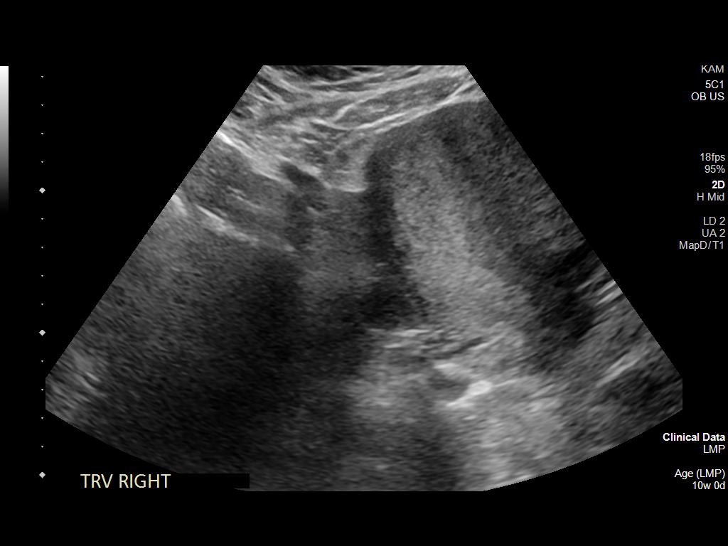
[im 30/90]
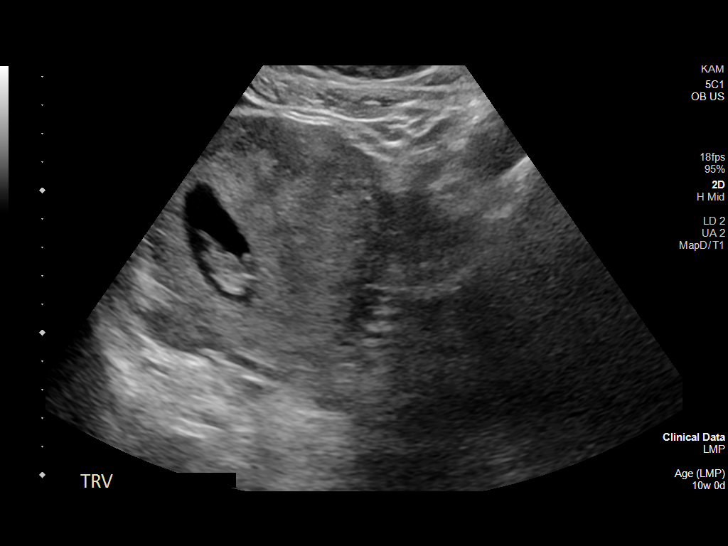
[im 37/90]
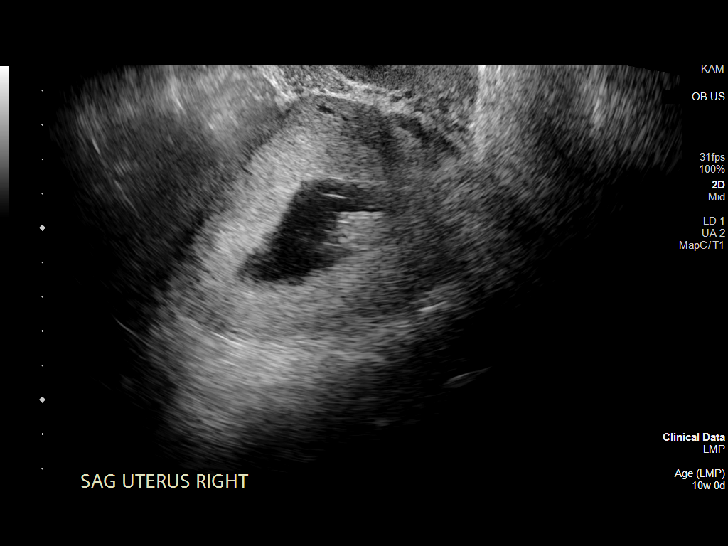
[im 43/90]
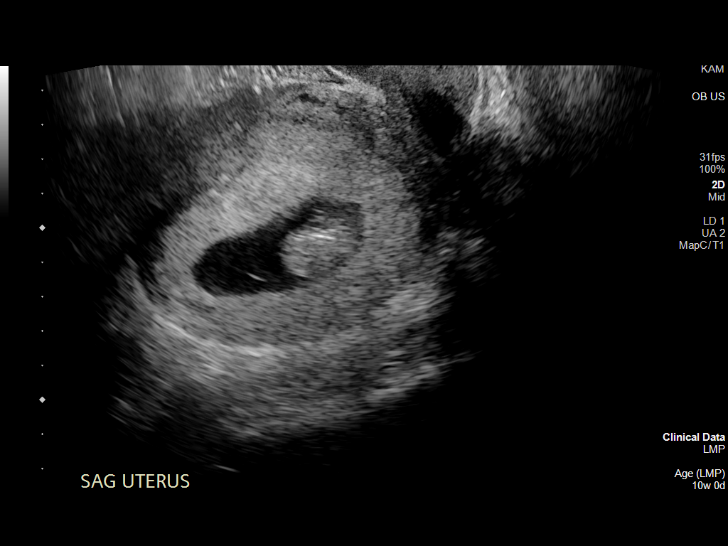
[im 50/90]
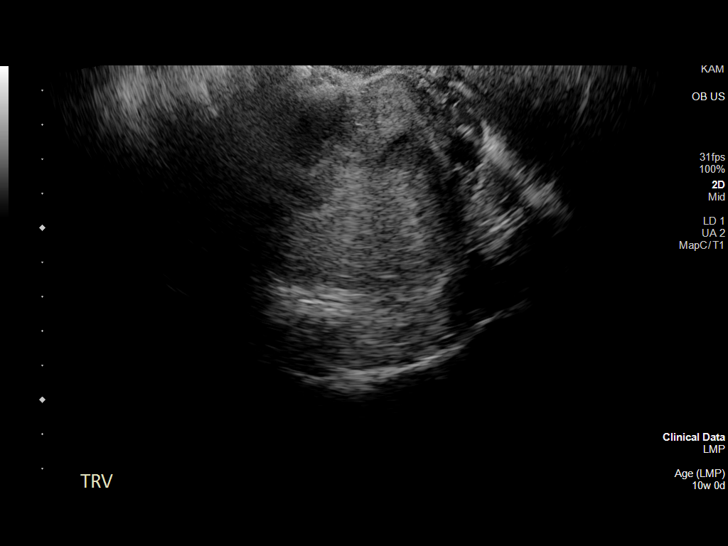
[im 57/90]
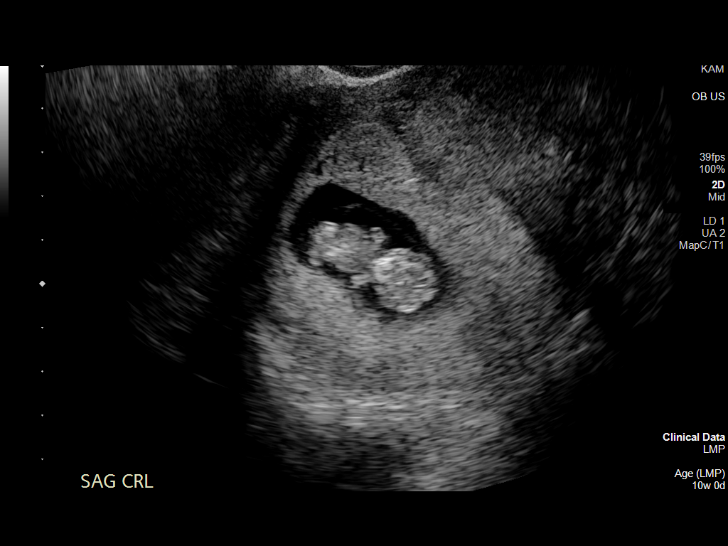
[im 63/90]
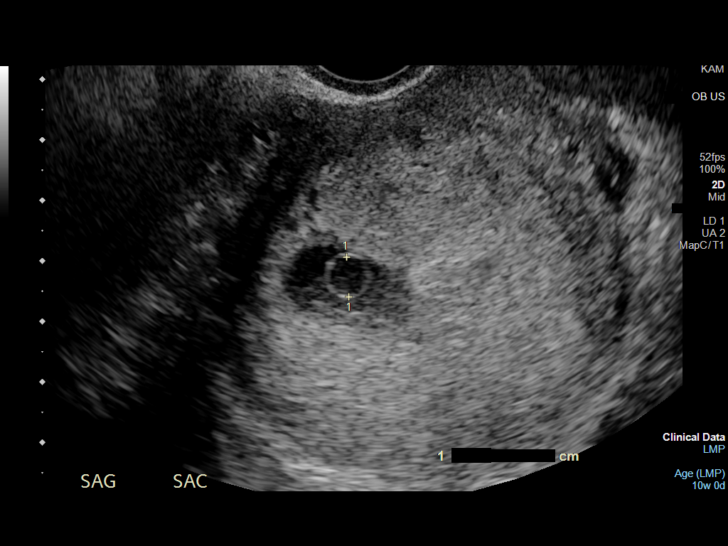
[im 70/90]
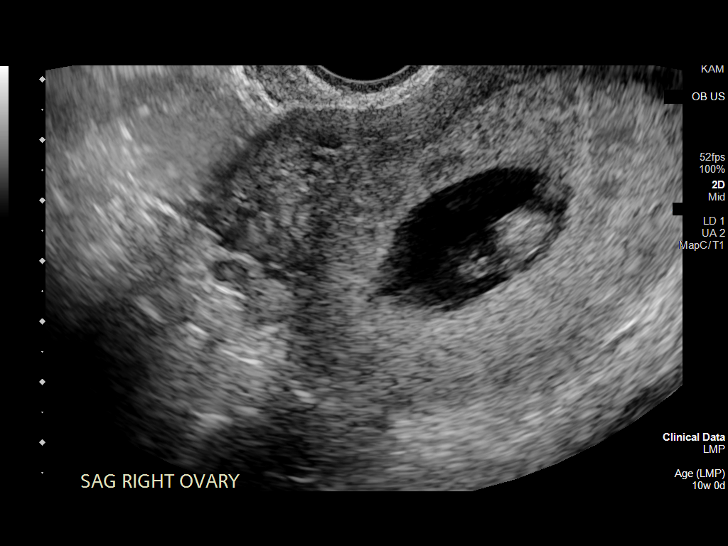
[im 76/90]
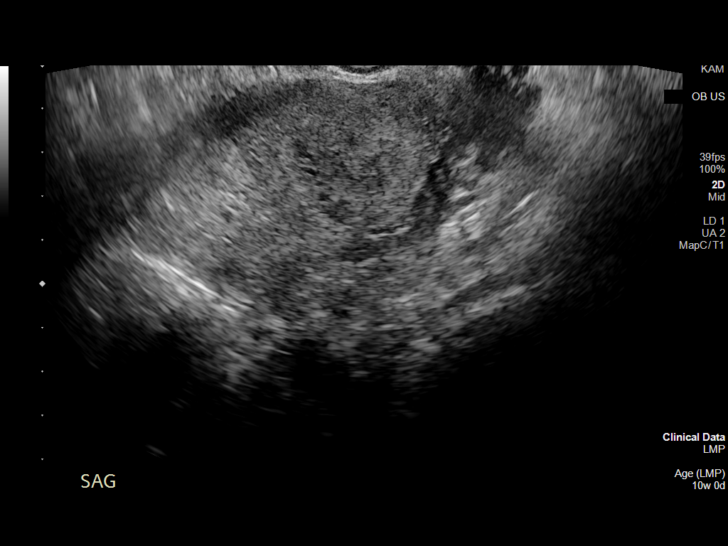
[im 83/90]
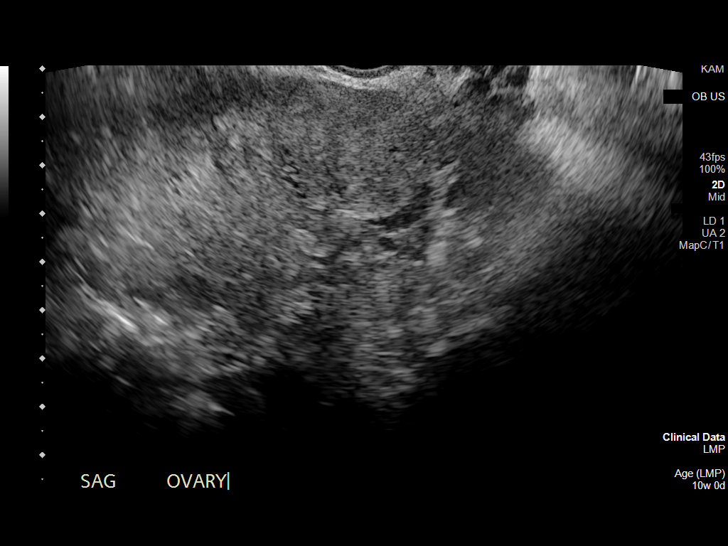
[im 90/90]
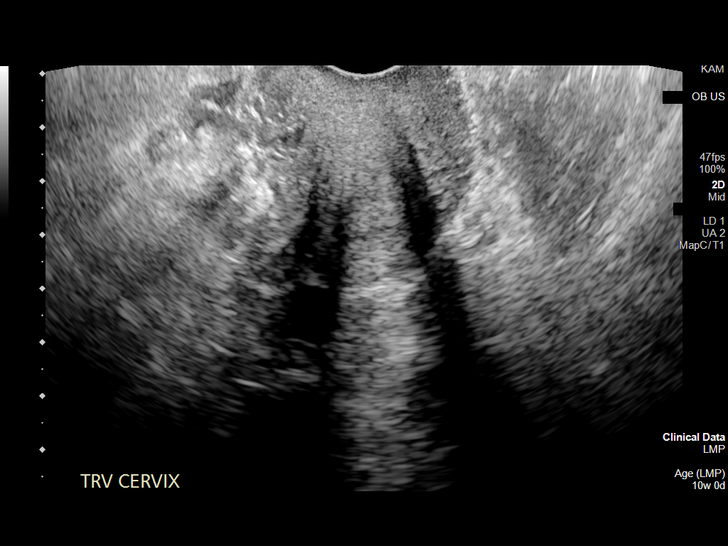

[14 of 28 positions shown; findings below may reference images not displayed]

FINDINGS: Intrauterine gestational sac: Present

Yolk sac:  Present

Embryo:  Present

Cardiac Activity: Present

Heart Rate: 164 bpm

CRL:  33.1 mm   10 w   1 d                  US EDC: 12/27/2021

Subchorionic hemorrhage:  None visualized.

Maternal uterus/adnexae: Ovaries are not well visualized due to
overlying bowel gas.
IMPRESSION: Single live intrauterine gestation at 10 weeks 1 day. No acute
abnormality noted.

## 2023-11-13 ENCOUNTER — Emergency Department (HOSPITAL_BASED_OUTPATIENT_CLINIC_OR_DEPARTMENT_OTHER)
Admission: EM | Admit: 2023-11-13 | Discharge: 2023-11-13 | Disposition: A | Discharge status: Home / Self Care | Payer: Medicaid Other | Attending: Emergency Medicine | Admitting: Emergency Medicine

## 2023-11-13 ENCOUNTER — Emergency Department (HOSPITAL_BASED_OUTPATIENT_CLINIC_OR_DEPARTMENT_OTHER): Payer: Medicaid Other

## 2023-11-13 ENCOUNTER — Encounter (HOSPITAL_BASED_OUTPATIENT_CLINIC_OR_DEPARTMENT_OTHER): Payer: Self-pay | Admitting: Emergency Medicine

## 2023-11-13 ENCOUNTER — Other Ambulatory Visit: Payer: Self-pay

## 2023-11-13 DIAGNOSIS — R300 Dysuria: Secondary | ICD-10-CM | POA: Diagnosis not present

## 2023-11-13 DIAGNOSIS — Z91040 Latex allergy status: Secondary | ICD-10-CM | POA: Insufficient documentation

## 2023-11-13 DIAGNOSIS — J45909 Unspecified asthma, uncomplicated: Secondary | ICD-10-CM | POA: Insufficient documentation

## 2023-11-13 DIAGNOSIS — R109 Unspecified abdominal pain: Secondary | ICD-10-CM | POA: Diagnosis present

## 2023-11-13 LAB — BASIC METABOLIC PANEL
Anion gap: 8 (ref 5–15)
BUN: 10 mg/dL (ref 6–20)
CO2: 27 mmol/L (ref 22–32)
Calcium: 9 mg/dL (ref 8.9–10.3)
Chloride: 103 mmol/L (ref 98–111)
Creatinine, Ser: 0.69 mg/dL (ref 0.44–1.00)
GFR, Estimated: >60 mL/min (ref >60)
Glucose, Bld: 101 mg/dL — ABNORMAL HIGH (ref 70–99)
Potassium: 3.9 mmol/L (ref 3.5–5.1)
Sodium: 138 mmol/L (ref 135–145)

## 2023-11-13 LAB — URINALYSIS, ROUTINE W REFLEX MICROSCOPIC
Bilirubin Urine: NEGATIVE
Glucose, UA: NEGATIVE mg/dL
Ketones, ur: NEGATIVE mg/dL
Leukocytes,Ua: NEGATIVE
Nitrite: NEGATIVE
Protein, ur: NEGATIVE mg/dL
Specific Gravity, Urine: >=1.03 (ref 1.005–1.030)
pH: 5.5 (ref 5.0–8.0)

## 2023-11-13 LAB — URINALYSIS, MICROSCOPIC (REFLEX)

## 2023-11-13 LAB — HEPATIC FUNCTION PANEL
ALT: 15 U/L (ref 0–44)
AST: 20 U/L (ref 15–41)
Albumin: 4.1 g/dL (ref 3.5–5.0)
Alkaline Phosphatase: 89 U/L (ref 38–126)
Bilirubin, Direct: <0.1 mg/dL (ref 0.0–0.2)
Total Bilirubin: 0.6 mg/dL (ref <1.2)
Total Protein: 7.7 g/dL (ref 6.5–8.1)

## 2023-11-13 LAB — CBC
HCT: 36.3 % (ref 36.0–46.0)
Hemoglobin: 11.7 g/dL — ABNORMAL LOW (ref 12.0–15.0)
MCH: 27.4 pg (ref 26.0–34.0)
MCHC: 32.2 g/dL (ref 30.0–36.0)
MCV: 85 fL (ref 80.0–100.0)
Platelets: 307 10*3/uL (ref 150–400)
RBC: 4.27 MIL/uL (ref 3.87–5.11)
RDW: 12.2 % (ref 11.5–15.5)
WBC: 8.2 10*3/uL (ref 4.0–10.5)
nRBC: 0 % (ref 0.0–0.2)

## 2023-11-13 LAB — LIPASE, BLOOD: Lipase: 48 U/L (ref 11–51)

## 2023-11-13 LAB — PREGNANCY, URINE: Preg Test, Ur: NEGATIVE

## 2023-11-13 MED ORDER — ACETAMINOPHEN 500 MG PO TABS
1000.0000 mg | ORAL_TABLET | Freq: Once | ORAL | Status: AC
  Administered 2023-11-13: 1000 mg via ORAL
  Filled 2023-11-13: qty 2

## 2023-11-13 MED ORDER — CIPROFLOXACIN HCL 500 MG PO TABS: 500.0000 mg | ORAL_TABLET | Freq: Two times a day (BID) | ORAL | 0 refills | Status: AC

## 2023-11-13 NOTE — ED Provider Notes (Signed)
[place] [facility] Provider Note   CSN: [id number] Arrival date & time: 11/13/23  1752     History  Chief Complaint  Patient presents with   Flank Pain   Dysuria    Kathleen [name] is a 27 y.o. female.   Flank Pain  Dysuria Associated symptoms: flank pain   27 year old female history of factor VII deficiency, asthma, anemia presenting for left flank pain.  Started about 3 days ago, she woke up with the pain.  No trauma.  Pain is in her left flank, and when she urinates she has some left lower quadrant pain.  Feels like her urine is slightly cloudy.  Has dysuria.  No blood in her urine.  She is currently on her menstrual period.  No vaginal discharge or concern for STI.  She is not concerned for pregnancy.  No chest pain or shortness of breath or pleuritic pain.  She takes Amicar and factor VII replacement as needed for bleeding.  No increased bleeding, gum bleeding, rashes recently.  No fevers or chills or vomiting.  Regular bowel movements.  No history of kidney stones.     Home Medications Prior to Admission medications   Medication Sig Start Date End Date Taking? Authorizing Provider  ciprofloxacin (CIPRO) 500 MG tablet Take 1 tablet (500 mg total) by mouth 2 (two) times daily for 7 days. 11/13/23 11/20/23 Yes Laurence Spates, MD  acetaminophen (TYLENOL) 500 MG tablet Take 500 mg by mouth every 6 (six) hours as needed.    [provider]  Doxylamine-Pyridoxine (DICLEGIS PO) Take by mouth.    [provider]  escitalopram (LEXAPRO) 10 MG tablet Take 10 mg by mouth daily. 05/25/19   [provider]  famotidine (PEPCID) 20 MG tablet Take 20 mg by mouth 2 (two) times daily.    [provider]  ondansetron (ZOFRAN-ODT) 4 MG disintegrating tablet Take 1 tablet (4 mg total) by mouth every 8 (eight) hours as needed for nausea or vomiting. 06/01/21   Koleen Distance, MD      Allergies    Citrus, Nsaids,  Penicillins, Latex, and Tea tree oil    Review of Systems   Review of Systems  Genitourinary:  Positive for dysuria and flank pain.  Review of systems completed and notable as per HPI.  ROS otherwise negative.   Physical Exam Updated Vital Signs BP (!) 117/91   Pulse 74   Temp 98.7 F (37.1 C) (Oral)   Resp 18   Ht 5\' 7"  (1.702 m)   Wt 104.3 kg   LMP 11/09/2023 (Exact Date)   SpO2 100%   BMI 36.02 kg/m  Physical Exam Vitals and nursing note reviewed.  Constitutional:      General: She is not in acute distress.    Appearance: She is well-developed.  HENT:     Head: Normocephalic and atraumatic.  Eyes:     Conjunctiva/sclera: Conjunctivae normal.  Cardiovascular:     Rate and Rhythm: Normal rate and regular rhythm.     Pulses: Normal pulses.     Heart sounds: Normal heart sounds. No murmur heard. Pulmonary:     Effort: Pulmonary effort is normal. No respiratory distress.     Breath sounds: Normal breath sounds.  Abdominal:     Palpations: Abdomen is soft.     Tenderness: There is no abdominal tenderness. There is left CVA tenderness. There is no right CVA tenderness, guarding or rebound.  Musculoskeletal:  General: No swelling.     Cervical back: Neck supple.     Comments: Mild tenderness of the left flank.  No overlying rash or skin changes.  Skin:    General: Skin is warm and dry.     Capillary Refill: Capillary refill takes less than 2 seconds.  Neurological:     Mental Status: She is alert.  Psychiatric:        Mood and Affect: Mood normal.     ED Results / Procedures / Treatments   Labs (all labs ordered are listed, but only abnormal results are displayed) Labs Reviewed  URINALYSIS, ROUTINE W REFLEX MICROSCOPIC - Abnormal; Notable for the following components:      Result Value   APPearance CLOUDY (*)    Hgb urine dipstick MODERATE (*)    All other components within normal limits  BASIC METABOLIC PANEL - Abnormal; Notable for the following  components:   Glucose, Bld 101 (*)    All other components within normal limits  CBC - Abnormal; Notable for the following components:   Hemoglobin 11.7 (*)    All other components within normal limits  URINALYSIS, MICROSCOPIC (REFLEX) - Abnormal; Notable for the following components:   Bacteria, UA RARE (*)    All other components within normal limits  URINE CULTURE  PREGNANCY, URINE  HEPATIC FUNCTION PANEL  LIPASE, BLOOD    EKG None  Radiology CT Renal Stone Study  Result Date: 11/13/2023 CLINICAL DATA:  Left flank pain EXAM: CT ABDOMEN AND PELVIS WITHOUT CONTRAST TECHNIQUE: Multidetector CT imaging of the abdomen and pelvis was performed following the standard protocol without IV contrast. RADIATION DOSE REDUCTION: This exam was performed according to the departmental dose-optimization program which includes automated exposure control, adjustment of the mA and/or kV according to patient size and/or use of iterative reconstruction technique. COMPARISON:  None Available. FINDINGS: Lower chest: No acute abnormality Hepatobiliary: No focal hepatic abnormality. Gallbladder unremarkable. Pancreas: No focal abnormality or ductal dilatation. Spleen: No focal abnormality.  Normal size. Adrenals/Urinary Tract: No adrenal abnormality. No focal renal abnormality. No stones or hydronephrosis. Urinary bladder is unremarkable. Stomach/Bowel: Normal appendix. Stomach, large and small bowel grossly unremarkable. Vascular/Lymphatic: No evidence of aneurysm or adenopathy. Reproductive: Uterus and adnexa unremarkable.  No mass. Other: No free fluid or free air. Musculoskeletal: No acute bony abnormality. IMPRESSION: Normal study. Electronically Signed   By: Charlett Nose M.D.   On: 11/13/2023 19:54    Procedures Procedures    Medications Ordered in ED Medications  acetaminophen (TYLENOL) tablet 1,000 mg (1,000 mg Oral Given 11/13/23 1824)    ED Course/ Medical Decision Making/ A&P                                  Medical Decision Making Amount and/or Complexity of Data Reviewed Labs: ordered. Radiology: ordered.  Risk OTC drugs. Prescription drug management.   Medical Decision Making:   Kathleen [name] is a 27 y.o. female who presented to the ED today with left flank pain, dysuria.  All signs reviewed.  Exam she has mild tenderness over the left flank and radiation into her left lower quadrant.  Has some dysuria as well.  Most concern of also kidney stone, infection, pyelonephritis.  Obtain CT scan, lab workup and evaluate her urine.  Her bowel exam is benign, no right-sided pain or tenderness low concern for appendicitis, cholecystitis, obstruction.  Less consistent with torsion.  She  does have history of coagulopathy which seems to be well-managed.   Patient placed on continuous vitals and telemetry monitoring while in ED which was reviewed periodically.  Reviewed and confirmed nursing documentation for past medical history, family history, social history.  Reassessment and Plan:   CT scan reviewed, no evidence of stones, hydronephrosis or other acute intra-abdominal pathology.  Lab work is overall reassuring.  CBC has stable hemoglobin.  She has a few red blood cells in her urine likely related to her.  I do not see white blood cells, although does have some rare bacteria.  No leukocytosis, no abnormality in renal function.  She is not pregnant.  She is not sexually active and has no vaginal discharge have low concern for PID.  She still has some left flank pain and dysuria although improved from initial evaluation.  I do she was quite concerned for stone which we did not see.  Clinically it seems more likely related to UTI possible [place].  She is no fever or vomiting or leukocytosis but does have CVA tenderness with dysuria.  Her urine is not particularly impressive I did add on a culture.  I had a discussion with her at bedside and shared decision making.  Will trial a course of  antibiotics to see if she does have a early UTI to see if this helps and follow-up her culture.  I recommend she follow with her doctors early she can for reevaluation.  Could be muscular component but this would not explain her dysuria.  I do not see any signs of complication from her coagulopathy.  I gave her very strict return precautions for any new or worsening symptoms.   Patient's presentation is most consistent with acute complicated illness / injury requiring diagnostic workup.           Final Clinical Impression(s) / ED Diagnoses Final diagnoses:  Left flank pain    Rx / DC Orders ED Discharge Orders          Ordered    ciprofloxacin (CIPRO) 500 MG tablet  2 times daily        11/13/23 2032              Laurence Spates, MD 11/13/23 2240

## 2023-11-13 NOTE — Discharge Instructions (Signed)
Your presentation overall is concerning for possible early urinary tract infection.  Your CT scan did not show any kidney stones and your blood work was reassuring.  We are starting antibiotics for possible UTI.  I recommend you follow-up with your doctor within the next week.  If you develop worsening pain, fever, persistent vomiting or any other new concerning symptoms you should return to the ED.

## 2023-11-13 NOTE — ED Triage Notes (Signed)
Pt reports dysuria w left sided flank and LLQ ABD pain that started Tues, reports symptoms are consistent with previous UTIs, denies hx of kidney stones, denies n/v/d, fever

## 2023-11-14 LAB — URINE CULTURE: Culture: NO GROWTH
